# Patient Record
Sex: Female | Born: 1940 | Race: White | Hispanic: No | Marital: Married | Smoking: Never smoker
Health system: Southern US, Community
[De-identification: ages and names within clinical notes are randomized; demographics above are authoritative.]

## PROBLEM LIST (undated history)

## (undated) DIAGNOSIS — I10 Essential (primary) hypertension: Secondary | ICD-10-CM

## (undated) DIAGNOSIS — M1990 Unspecified osteoarthritis, unspecified site: Secondary | ICD-10-CM

## (undated) DIAGNOSIS — E119 Type 2 diabetes mellitus without complications: Secondary | ICD-10-CM

## (undated) DIAGNOSIS — G4730 Sleep apnea, unspecified: Secondary | ICD-10-CM

## (undated) DIAGNOSIS — E785 Hyperlipidemia, unspecified: Secondary | ICD-10-CM

## (undated) DIAGNOSIS — D472 Monoclonal gammopathy: Secondary | ICD-10-CM

## (undated) DIAGNOSIS — E079 Disorder of thyroid, unspecified: Secondary | ICD-10-CM

## (undated) HISTORY — DX: Monoclonal gammopathy: D47.2

## (undated) HISTORY — PX: OVARY SURGERY: SHX727

## (undated) HISTORY — DX: Unspecified osteoarthritis, unspecified site: M19.90

## (undated) HISTORY — DX: Hyperlipidemia, unspecified: E78.5

## (undated) HISTORY — DX: Essential (primary) hypertension: I10

## (undated) HISTORY — PX: TONSILLECTOMY AND ADENOIDECTOMY: SUR1326

## (undated) HISTORY — DX: Sleep apnea, unspecified: G47.30

## (undated) HISTORY — PX: NOSE SURGERY: SHX723

## (undated) HISTORY — DX: Disorder of thyroid, unspecified: E07.9

## (undated) HISTORY — PX: CARPAL TUNNEL RELEASE: SHX101

## (undated) HISTORY — DX: Type 2 diabetes mellitus without complications: E11.9

---

## 1998-01-26 ENCOUNTER — Other Ambulatory Visit: Admission: RE | Admit: 1998-01-26 | Discharge: 1998-01-26 | Payer: Self-pay | Admitting: Obstetrics and Gynecology

## 1998-03-24 ENCOUNTER — Ambulatory Visit (HOSPITAL_COMMUNITY): Admission: RE | Admit: 1998-03-24 | Discharge: 1998-03-24 | Payer: Self-pay | Admitting: Obstetrics and Gynecology

## 1999-02-26 ENCOUNTER — Other Ambulatory Visit: Admission: RE | Admit: 1999-02-26 | Discharge: 1999-02-26 | Payer: Self-pay | Admitting: Obstetrics and Gynecology

## 2000-02-25 ENCOUNTER — Other Ambulatory Visit: Admission: RE | Admit: 2000-02-25 | Discharge: 2000-02-25 | Payer: Self-pay | Admitting: Obstetrics and Gynecology

## 2001-02-28 ENCOUNTER — Other Ambulatory Visit: Admission: RE | Admit: 2001-02-28 | Discharge: 2001-02-28 | Payer: Self-pay | Admitting: Obstetrics and Gynecology

## 2002-03-08 ENCOUNTER — Other Ambulatory Visit: Admission: RE | Admit: 2002-03-08 | Discharge: 2002-03-08 | Payer: Self-pay | Admitting: Obstetrics and Gynecology

## 2003-03-10 ENCOUNTER — Other Ambulatory Visit: Admission: RE | Admit: 2003-03-10 | Discharge: 2003-03-10 | Payer: Self-pay | Admitting: Obstetrics and Gynecology

## 2006-09-19 ENCOUNTER — Ambulatory Visit: Payer: Self-pay | Admitting: Gastroenterology

## 2006-09-22 ENCOUNTER — Ambulatory Visit: Payer: Self-pay | Admitting: Gastroenterology

## 2007-10-25 ENCOUNTER — Encounter: Admission: RE | Admit: 2007-10-25 | Discharge: 2007-10-25 | Payer: Self-pay | Admitting: Internal Medicine

## 2009-05-11 ENCOUNTER — Encounter: Payer: Self-pay | Admitting: Gastroenterology

## 2009-05-15 ENCOUNTER — Ambulatory Visit: Payer: Self-pay | Admitting: Oncology

## 2009-05-15 ENCOUNTER — Telehealth (INDEPENDENT_AMBULATORY_CARE_PROVIDER_SITE_OTHER): Payer: Self-pay

## 2009-05-15 ENCOUNTER — Encounter (INDEPENDENT_AMBULATORY_CARE_PROVIDER_SITE_OTHER): Payer: Self-pay

## 2009-05-22 LAB — CBC WITH DIFFERENTIAL/PLATELET
BASO%: 0.4 % (ref 0.0–2.0)
EOS%: 1.7 % (ref 0.0–7.0)
HCT: 37.2 % (ref 34.8–46.6)
LYMPH%: 25.9 % (ref 14.0–49.7)
MCH: 32.4 pg (ref 25.1–34.0)
MCHC: 33.9 g/dL (ref 31.5–36.0)
NEUT%: 66.6 % (ref 38.4–76.8)
Platelets: 216 10*3/uL (ref 145–400)

## 2009-05-26 LAB — IMMUNOFIXATION ELECTROPHORESIS
IgA: 309 mg/dL (ref 68–378)
IgG (Immunoglobin G), Serum: 1120 mg/dL (ref 694–1618)
IgM, Serum: 69 mg/dL (ref 60–263)

## 2009-05-26 LAB — COMPREHENSIVE METABOLIC PANEL
AST: 24 U/L (ref 0–37)
Alkaline Phosphatase: 60 U/L (ref 39–117)
BUN: 16 mg/dL (ref 6–23)
Calcium: 10.7 mg/dL — ABNORMAL HIGH (ref 8.4–10.5)
Chloride: 100 mEq/L (ref 96–112)
Creatinine, Ser: 0.82 mg/dL (ref 0.40–1.20)

## 2009-05-26 LAB — KAPPA/LAMBDA LIGHT CHAINS: Lambda Free Lght Chn: 1.7 mg/dL (ref 0.57–2.63)

## 2009-06-01 LAB — UIFE/LIGHT CHAINS/TP QN, 24-HR UR
Beta, Urine: DETECTED — AB
Free Kappa Lt Chains,Ur: 0.13 mg/dL (ref 0.04–1.51)
Free Lambda Lt Chains,Ur: <0.05 mg/dL — ABNORMAL LOW (ref 0.08–1.01)
Free Lt Chn Excr Rate: 2.93 mg/d
Gamma Globulin, Urine: DETECTED — AB
Total Protein, Urine-Ur/day: 18 mg/d (ref 10–140)
Volume, Urine: 2250 mL

## 2009-06-01 LAB — CREATININE CLEARANCE, URINE, 24 HOUR
Collection Interval-CRCL: 24 hours
Creatinine Clearance: 95 mL/min (ref 75–115)
Creatinine, 24H Ur: 1003 mg/d (ref 700–1800)

## 2009-06-10 ENCOUNTER — Encounter (INDEPENDENT_AMBULATORY_CARE_PROVIDER_SITE_OTHER): Payer: Self-pay | Admitting: *Deleted

## 2009-06-29 ENCOUNTER — Encounter (INDEPENDENT_AMBULATORY_CARE_PROVIDER_SITE_OTHER): Payer: Self-pay | Admitting: *Deleted

## 2009-06-30 ENCOUNTER — Encounter (INDEPENDENT_AMBULATORY_CARE_PROVIDER_SITE_OTHER): Payer: Self-pay | Admitting: *Deleted

## 2009-06-30 ENCOUNTER — Ambulatory Visit: Payer: Self-pay | Admitting: Gastroenterology

## 2009-07-03 ENCOUNTER — Telehealth: Payer: Self-pay | Admitting: Gastroenterology

## 2009-07-08 ENCOUNTER — Ambulatory Visit: Payer: Self-pay | Admitting: Gastroenterology

## 2009-07-08 LAB — CONVERTED CEMR LAB: IgA: 255 mg/dL (ref 68–378)

## 2009-07-10 LAB — CONVERTED CEMR LAB: Tissue Transglutaminase Ab, IgA: 0.6 units (ref <7)

## 2009-07-14 ENCOUNTER — Ambulatory Visit: Payer: Self-pay | Admitting: Gastroenterology

## 2009-07-15 ENCOUNTER — Encounter: Payer: Self-pay | Admitting: Gastroenterology

## 2009-09-22 ENCOUNTER — Ambulatory Visit: Payer: Self-pay | Admitting: Oncology

## 2009-09-24 LAB — CBC WITH DIFFERENTIAL/PLATELET
Basophils Absolute: 0 10*3/uL (ref 0.0–0.1)
Eosinophils Absolute: 0.1 10*3/uL (ref 0.0–0.5)
HCT: 34 % — ABNORMAL LOW (ref 34.8–46.6)
LYMPH%: 29.4 % (ref 14.0–49.7)
MCV: 94.2 fL (ref 79.5–101.0)
MONO#: 0.4 10*3/uL (ref 0.1–0.9)
MONO%: 6.9 % (ref 0.0–14.0)
NEUT#: 3.2 10*3/uL (ref 1.5–6.5)
NEUT%: 61.5 % (ref 38.4–76.8)
Platelets: 196 10*3/uL (ref 145–400)
WBC: 5.2 10*3/uL (ref 3.9–10.3)

## 2009-09-25 LAB — IGG, IGA, IGM
IgA: 273 mg/dL (ref 68–378)
IgG (Immunoglobin G), Serum: 987 mg/dL (ref 694–1618)
IgM, Serum: 66 mg/dL (ref 60–263)

## 2009-09-25 LAB — KAPPA/LAMBDA LIGHT CHAINS: Kappa free light chain: 0.64 mg/dL (ref 0.33–1.94)

## 2009-09-25 LAB — COMPREHENSIVE METABOLIC PANEL
ALT: 19 U/L (ref 0–35)
BUN: 17 mg/dL (ref 6–23)
CO2: 30 mEq/L (ref 19–32)
Calcium: 9.4 mg/dL (ref 8.4–10.5)
Chloride: 100 mEq/L (ref 96–112)
Creatinine, Ser: 0.73 mg/dL (ref 0.40–1.20)
Glucose, Bld: 168 mg/dL — ABNORMAL HIGH (ref 70–99)

## 2009-12-05 ENCOUNTER — Ambulatory Visit: Payer: Self-pay | Admitting: Internal Medicine

## 2010-01-27 ENCOUNTER — Ambulatory Visit: Payer: Self-pay | Admitting: Oncology

## 2010-01-29 LAB — COMPREHENSIVE METABOLIC PANEL
ALT: 21 U/L (ref 0–35)
Albumin: 3.9 g/dL (ref 3.5–5.2)
Alkaline Phosphatase: 56 U/L (ref 39–117)
CO2: 30 mEq/L (ref 19–32)
Glucose, Bld: 160 mg/dL — ABNORMAL HIGH (ref 70–99)
Potassium: 3.8 mEq/L (ref 3.5–5.3)
Sodium: 139 mEq/L (ref 135–145)
Total Bilirubin: 0.4 mg/dL (ref 0.3–1.2)
Total Protein: 7 g/dL (ref 6.0–8.3)

## 2010-01-29 LAB — CBC WITH DIFFERENTIAL/PLATELET
Basophils Absolute: 0 10*3/uL (ref 0.0–0.1)
Eosinophils Absolute: 0.1 10*3/uL (ref 0.0–0.5)
HGB: 11.7 g/dL (ref 11.6–15.9)
MCV: 93.3 fL (ref 79.5–101.0)
MONO%: 6.6 % (ref 0.0–14.0)
NEUT#: 3.3 10*3/uL (ref 1.5–6.5)
RBC: 3.74 10*6/uL (ref 3.70–5.45)
RDW: 13.1 % (ref 11.2–14.5)
WBC: 5.3 10*3/uL (ref 3.9–10.3)
lymph#: 1.5 10*3/uL (ref 0.9–3.3)

## 2010-01-29 LAB — IGG, IGA, IGM: IgG (Immunoglobin G), Serum: 1120 mg/dL (ref 694–1618)

## 2010-01-29 LAB — LACTATE DEHYDROGENASE: LDH: 145 U/L (ref 94–250)

## 2010-07-06 NOTE — Letter (Signed)
Summary: Patient [facility] Biopsy Results  [facility]  [address] [place], [place] [address]   Phone: [phone]  Fax: [phone]        July 15, 2009 MRN: [id number]    [facility] [address] [place], [place]  [address]    Dear Ms. Munsch,  I am pleased to inform you that the biopsies taken during your recent endoscopic examination did not show any evidence of cancer upon pathologic examination. The biopsies were normal.  Continue with the treatment plan as outlined on the day of your      exam.  Please call us if you are having persistent problems or have questions about your condition that have not been fully answered at this time.  Sincerely,  Meryl Dare MD [facility]  This letter has been electronically signed by your physician.

## 2010-07-06 NOTE — Progress Notes (Signed)
Summary: ? re: labs   Phone Note Call from Patient Call back at Home Phone [phone]   Caller: Patient Call For: Russella Dar Reason for Call: Talk to Nurse Summary of Call: Patient has questions regarding her procedure. She is scheduled for an EGD on 07-14-09 but she received a letter stating that she needs to have blood work done before her procedure. Patient wants to verify this with a nurse Initial call taken by: Tawni Levy,  July 03, 2009 9:30 AM  Follow-up for Phone Call        Called pt and left message we had returned her call and to call our office back. Ulis Rias RN  July 03, 2009 2:37 PM  I have left the patient a message reminding of the lab work Dr Russella Dar requested.  I have asked her to come for lab work prior to the EGD 07-14-09 Follow-up by: Darcey Nora RN, CGRN,  July 06, 2009 9:53 AM

## 2010-07-06 NOTE — Procedures (Signed)
Summary: Upper Endoscopy  Patient: Felicia Cortez Note: All result statuses are Final unless otherwise noted.  Tests: (1) Upper Endoscopy (EGD)   EGD Upper Endoscopy       DONE     [facility]     [address]     [place], [place]  [address]           ENDOSCOPY PROCEDURE REPORT           PATIENT:  Felicia Cortez, Felicia Cortez  MR#:  [id number]     BIRTHDATE:  02-Apr-1941, 68 yrs. old  GENDER:  female           ENDOSCOPIST:  Judie Petit T. Russella Dar, MD, [facility]           PROCEDURE DATE:  07/14/2009     PROCEDURE:  EGD with biopsy     ASA CLASS:  Class II     INDICATIONS:  iron deficiency anemia           MEDICATIONS:  Fentanyl 50 mcg IV, Versed 5 mg IV     TOPICAL ANESTHETIC:  Exactacain Spray           DESCRIPTION OF PROCEDURE:   After the risks benefits and     alternatives of the procedure were thoroughly explained, informed     consent was obtained.  The LB GIF-H180 [id number] endoscope was     introduced through the mouth and advanced to the second portion of     the duodenum, without limitations.  The instrument was slowly     withdrawn as the mucosa was fully examined.     <<PROCEDUREIMAGES>>           Mild gastritis was found in the total stomach. It was mild,     erythematous and granular. Biopsies of the antrum and body of the     stomach were obtained and sent to pathology. The esophagus and     gastroesophageal junction were completely normal in appearance.     The duodenal bulb was normal in appearance, as was the postbulbar     duodenum. Random biopsies were obtained in the duodenum and sent     to pathology.  Retroflexed views revealed no abnormalities. The     scope was then withdrawn from the patient and the procedure     completed.           COMPLICATIONS:  None           ENDOSCOPIC IMPRESSION:     1) Mild, diffuse gastritis           RECOMMENDATIONS:     1) await pathology results     2) follow-up: primary MD as planned     3) If her fe deficiency anemia is refractory  to treatment consider     a capsule endoscopy to further evaluate           Ellena Kamen T. Russella Dar, MD, [facility]           CC:  Creola Corn, MD           n.     Rosalie DoctorVenita Lick. Jamori Biggar at 07/14/2009 08:55 AM           Thurmond Butts, [id number]  Note: An exclamation mark (!) indicates a result that was not dispersed into the flowsheet. Document Creation Date: 07/14/2009 8:56 AM _______________________________________________________________________  (1) Order result status: Final Collection or observation date-time: 07/14/2009 08:46 Requested date-time:  Receipt date-time:  Reported date-time:  Referring Physician:   Ordering Physician: Claudette Head [phone]) Specimen Source:  Source: Launa Grill Order Number: [id number] Lab site:

## 2010-07-06 NOTE — Miscellaneous (Signed)
Summary: [facility] Previsit/prep  Clinical Lists Changes  Observations: Added new observation of NKA: T (06/30/2009 14:16)

## 2010-07-06 NOTE — Letter (Signed)
Summary: Appointment Reminder  [facility]  [address] [place], [place] [address]   Phone: [phone]  Fax: [phone]        June 10, 2009 MRN: [id number]    [facility] [address] [place], [place]  [address]    Dear Ms. Chastang,   We have been unable to reach you by phone to reschedule a Endoscopy  appointment that was recommended for you by Dr. Russella Dar. It is very   important that we reach you to reschedule this  appointment because the   physician will not be available on June 19, 2009. We apologize for   the inconvenience and thank you for allowing  [place] to participate in your   health care needs. Please contact us at [phone] at your earliest   convenience to reschedule your appointment.     Sincerely,  [employer]

## 2010-07-06 NOTE — Letter (Signed)
Summary: Diabetic Instructions  [facility]  [address] [place], [place] [address]   Phone: [phone]  Fax: [phone]    Felicia Cortez 03-05-1941 MRN: [id number]   _  _   ORAL DIABETIC MEDICATION INSTRUCTIONS  The day before your procedure:   Take your diabetic pill as you do normally  The day of your procedure:   Do not take your diabetic pill    We will check your blood sugar levels during the admission process and again in Recovery before discharging you home  ________________________________________________________________________

## 2010-07-06 NOTE — Letter (Signed)
Summary: EGD Instructions  [facility]  [address] [place], [place] [address]   Phone: [phone]  Fax: [phone]       Felicia Cortez    12-11-1940    MRN: [id number]       Procedure Day Dorna Bloom:  Jake Shark 07/14/09     Arrival Time:  7:30AM     Procedure Time:  8:30AM     Location of Procedure:                    [facility] (4th Floor)    PREPARATION FOR ENDOSCOPY   On 07/14/09 THE DAY OF THE PROCEDURE:  1.   No solid foods, milk or milk products are allowed after midnight the night before your procedure.  2.   Do not drink anything colored red or purple.  Avoid juices with pulp.  No orange juice.  3.  You may drink clear liquids until 6:30AM, which is 2 hours before your procedure.                                                                                                CLEAR LIQUIDS INCLUDE: Water Jello Ice Popsicles Tea (sugar ok, no milk/cream) Powdered fruit flavored drinks Coffee (sugar ok, no milk/cream) Gatorade Juice: apple, white grape, white cranberry  Lemonade Clear bullion, consomm, broth Carbonated beverages (any kind) Strained chicken noodle soup Hard Candy   MEDICATION INSTRUCTIONS  Unless otherwise instructed, you should take regular prescription medications with a small sip of water as early as possible the morning of your procedure.  Diabetic patients - see separate instructions.   Additional medication instructions: Hold Blood pressure medicine and diuretic the morning of procedure.             OTHER INSTRUCTIONS  You will need a responsible adult at least 69 years of age to accompany you and drive you home.   This person must remain in the waiting room during your procedure.  Wear loose fitting clothing that is easily removed.  Leave jewelry and other valuables at home.  However, you may wish to bring a book to read or an iPod/MP3 player to listen to music as you wait for your procedure to  start.  Remove all body piercing jewelry and leave at home.  Total time from sign-in until discharge is approximately 2-3 hours.  You should go home directly after your procedure and rest.  You can resume normal activities the day after your procedure.  The day of your procedure you should not:   Drive   Make legal decisions   Operate machinery   Drink alcohol   Return to work  You will receive specific instructions about eating, activities and medications before you leave.    The above instructions have been reviewed and explained to me by  Wyona Almas RN  June 30, 2009 3:02 PM     I fully understand and can verbalize these instructions _____________________________ Date _________

## 2010-08-02 ENCOUNTER — Other Ambulatory Visit (HOSPITAL_COMMUNITY): Payer: Self-pay | Admitting: Oncology

## 2010-08-02 ENCOUNTER — Encounter (HOSPITAL_BASED_OUTPATIENT_CLINIC_OR_DEPARTMENT_OTHER): Payer: Medicare Other | Admitting: Oncology

## 2010-08-02 DIAGNOSIS — D472 Monoclonal gammopathy: Secondary | ICD-10-CM

## 2010-08-02 LAB — LACTATE DEHYDROGENASE: LDH: 126 U/L (ref 94–250)

## 2010-08-02 LAB — CBC WITH DIFFERENTIAL/PLATELET
BASO%: 0.4 % (ref 0.0–2.0)
HCT: 34.4 % — ABNORMAL LOW (ref 34.8–46.6)
LYMPH%: 27.8 % (ref 14.0–49.7)
MCHC: 34.4 g/dL (ref 31.5–36.0)
MCV: 92.2 fL (ref 79.5–101.0)
MONO#: 0.5 10*3/uL (ref 0.1–0.9)
NEUT%: 62.5 % (ref 38.4–76.8)
Platelets: 194 10*3/uL (ref 145–400)
WBC: 5.9 10*3/uL (ref 3.9–10.3)

## 2010-08-02 LAB — COMPREHENSIVE METABOLIC PANEL
ALT: 21 U/L (ref 0–35)
CO2: 29 mEq/L (ref 19–32)
Creatinine, Ser: 0.8 mg/dL (ref 0.40–1.20)
Glucose, Bld: 173 mg/dL — ABNORMAL HIGH (ref 70–99)
Total Bilirubin: 0.4 mg/dL (ref 0.3–1.2)

## 2010-08-02 LAB — IGG, IGA, IGM
IgG (Immunoglobin G), Serum: 1100 mg/dL (ref 694–1618)
IgM, Serum: 67 mg/dL (ref 60–263)

## 2010-08-02 LAB — SEDIMENTATION RATE: Sed Rate: 36 mm/hr — ABNORMAL HIGH (ref 0–22)

## 2010-10-22 NOTE — Assessment & Plan Note (Signed)
[facility]                         GASTROENTEROLOGY OFFICE NOTE   SATARA, VIRELLA                        MRN:          [id number]  DATE:09/19/2006                            DOB:          05-25-1941    REASON FOR REFERRAL:  Diarrhea, abdominal pain, and rectal bleeding.   HISTORY OF PRESENT ILLNESS:  Mrs. Felicia Cortez is a 70 year old female that I  have seen in the past with a history of diverticulitis, GERD, bleeding  hemorrhoids, and hepatic steatosis. She underwent colonoscopy with  destruction of internal hemorrhoids in April 2002. Sigmoid colon  diverticulosis was noted at that time. She relates problems with lower  abdominal pain and frequent diarrhea since beginning Metformin and  Glumetza. These medications started in January and she has been using  Imodium to attempt to control the symptoms. Glumetza was stopped this  past Thursday and her abdominal pain and diarrhea have abated. She has  noted small amounts of bright red blood per rectum over the past two  weeks associated with loose stools. She has no weight loss, nausea,  vomiting, fevers, or chills, and she denies any recent antibiotic usage.  CBC from Dr. Ferd Hibbs office dated 09/15/06 was unremarkable.   FAMILY HISTORY:  Negative for colon cancer, colon polyps, and  inflammatory bowel disease.   CURRENT MEDICATIONS:  Listed on the chart, updated and reviewed.   ALLERGIES:  None known.   PAST MEDICAL HISTORY:  Diabetes mellitus, hypertension, hyperlipidemia,  hypothyroidism, sleep apnea on CPAP, diverticulosis, internal  hemorrhoids, history of diverticulitis, history of hepatic steatosis,  status post right carpal tunnel release, status post partial bilateral  oophorectomy in 1980, status post tonsillectomy, status post B and C,  status post appendectomy.   SOCIAL HISTORY:  Per the handwritten form.   REVIEW OF SYSTEMS:  Per the handwritten form.   PHYSICAL EXAMINATION:  GENERAL:   Obese white female in no acute  distress.  VITAL SIGNS:  Height 5 feet 1 1/2 inches, weight 222 pounds, blood  pressure 114/62, pulse 68 and regular.  HEENT:  Anicteric sclerae, oral pharynx clear.  CHEST:  Clear to auscultation bilaterally.  CARDIAC:  Regular rate and rhythm without murmurs appreciated.  ABDOMEN:  Large, soft, nontender, nondistended, normal active bowel  sounds, no palpable organomegaly, masses, or hernias.  RECTAL:  Differed to time of colonoscopy.  EXTREMITIES:  Without cyanosis, clubbing, or edema.  NEUROLOGIC:  Awake, alert, and oriented x3. Grossly non-focal.   ASSESSMENT AND PLAN:  1. Diarrhea and hematochezia. Her diarrhea appears to be a side effect      of Glumetza. Since this medication was discontinued, her symptoms      have abated. I suspect she has recurrent hemorrhoids leading to her      rectal bleeding, but colorectal neoplasms and other disorders need      to be excluded. Risk, benefits, and alternatives to colonoscopy      with possibly biopsy, possibly polypectomy, and possible      destruction of internal hemorrhoids discussed with the patient and      she consents to proceed. This  will be scheduled electively.  2. Diverticulosis and history of diverticulitis. Maintain a long term      high fiber diet with adequate fluid intake.     Venita Lick. Russella Dar, MD, [facility]  Electronically Signed    MTS/MedQ  DD: 09/20/2006  DT: 09/21/2006  Job #: [id number]   cc:   Gwen Pounds, MD

## 2011-02-01 ENCOUNTER — Encounter (HOSPITAL_BASED_OUTPATIENT_CLINIC_OR_DEPARTMENT_OTHER): Payer: Medicare Other | Admitting: Oncology

## 2011-02-01 ENCOUNTER — Other Ambulatory Visit (HOSPITAL_COMMUNITY): Payer: Self-pay | Admitting: Oncology

## 2011-02-01 DIAGNOSIS — D472 Monoclonal gammopathy: Secondary | ICD-10-CM

## 2011-02-01 LAB — CBC WITH DIFFERENTIAL/PLATELET
BASO%: 0.4 % (ref 0.0–2.0)
Basophils Absolute: 0 10*3/uL (ref 0.0–0.1)
EOS%: 1.9 % (ref 0.0–7.0)
HCT: 35.9 % (ref 34.8–46.6)
LYMPH%: 26.9 % (ref 14.0–49.7)
MCH: 32.2 pg (ref 25.1–34.0)
MCHC: 34.9 g/dL (ref 31.5–36.0)
MCV: 92.2 fL (ref 79.5–101.0)
MONO%: 8.3 % (ref 0.0–14.0)
NEUT%: 62.5 % (ref 38.4–76.8)
Platelets: 167 10*3/uL (ref 145–400)

## 2011-02-02 LAB — COMPREHENSIVE METABOLIC PANEL
ALT: 26 U/L (ref 0–35)
AST: 28 U/L (ref 0–37)
Calcium: 10.1 mg/dL (ref 8.4–10.5)
Chloride: 97 mEq/L (ref 96–112)
Creatinine, Ser: 0.87 mg/dL (ref 0.50–1.10)

## 2011-02-02 LAB — IGG, IGA, IGM: IgM, Serum: 63 mg/dL (ref 52–322)

## 2011-06-16 ENCOUNTER — Telehealth: Payer: Self-pay | Admitting: Oncology

## 2011-06-16 NOTE — Telephone Encounter (Signed)
spoke w/ spouse and he is aware of 08/08/11 appt   aom

## 2011-06-17 ENCOUNTER — Telehealth: Payer: Self-pay

## 2011-06-17 NOTE — Telephone Encounter (Signed)
Received a letter of denial of claim from insurance company. Felicia Cortez said she would take letter to billing.

## 2011-08-08 ENCOUNTER — Other Ambulatory Visit: Payer: Medicare Other | Admitting: Lab

## 2011-08-08 ENCOUNTER — Ambulatory Visit (HOSPITAL_BASED_OUTPATIENT_CLINIC_OR_DEPARTMENT_OTHER): Payer: Medicare Other | Admitting: Oncology

## 2011-08-08 ENCOUNTER — Encounter: Payer: Self-pay | Admitting: Oncology

## 2011-08-08 ENCOUNTER — Telehealth: Payer: Self-pay | Admitting: Oncology

## 2011-08-08 DIAGNOSIS — E538 Deficiency of other specified B group vitamins: Secondary | ICD-10-CM

## 2011-08-08 DIAGNOSIS — D472 Monoclonal gammopathy: Secondary | ICD-10-CM

## 2011-08-08 DIAGNOSIS — E119 Type 2 diabetes mellitus without complications: Secondary | ICD-10-CM

## 2011-08-08 DIAGNOSIS — D649 Anemia, unspecified: Secondary | ICD-10-CM | POA: Insufficient documentation

## 2011-08-08 LAB — CBC WITH DIFFERENTIAL/PLATELET
Basophils Absolute: 0 10*3/uL (ref 0.0–0.1)
Eosinophils Absolute: 0.1 10*3/uL (ref 0.0–0.5)
HCT: 33.1 % — ABNORMAL LOW (ref 34.8–46.6)
HGB: 11.5 g/dL — ABNORMAL LOW (ref 11.6–15.9)
MCH: 31.8 pg (ref 25.1–34.0)
MONO#: 0.4 10*3/uL (ref 0.1–0.9)
NEUT%: 63 % (ref 38.4–76.8)
WBC: 5.8 10*3/uL (ref 3.9–10.3)
lymph#: 1.6 10*3/uL (ref 0.9–3.3)

## 2011-08-08 NOTE — Progress Notes (Signed)
CC:   Gwen Pounds, MD  PROBLEM LIST: 1. 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  Urine immunofixation electrophoresis was     negative.  24 hour urine protein was 18.  The patient has not     undergone metastatic bone survey or a bone marrow exam. 2. Low-grade intermittent anemia. 3. History of pernicious anemia diagnosed 2005, on sublingual vitamin     B12 supplements. 4. Diabetes mellitus type 2. 5. Dyslipidemia. 6. Hypertension. 7. Hypothyroidism. 8. Sleep apnea treated with CPAP. 9. History of elevated sedimentation rate. 10.Soft systolic ejection murmur.  MEDICATIONS: 1. Calcium and vitamin D 500-200 mg 1 daily. 2. Byetta inject 10 units twice daily. 3. Amaryl 4 mg twice daily. 4. Hydrochlorothiazide 12.5 mg daily. 5. Lantus insulin 30 units at bedtime. 6. Avalide 150-12.5 mg daily. 7. Levothyroxine 100 mcg daily. 8. Multivitamins daily. 9. Zocor 20 mg at bedtime.  HISTORY:  I am seeing Mrs. 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  The patient was last seen by [place] on 02/01/2011.  There has been no significant change in her condition. She does have some feelings of fatigue, postnasal drip, alteration of taste over the past 6 months, and some swelling of her fingers.  She may be developing some arthritis.  The patient denies any evidence of blood in her stools.  She had a colonoscopy a few years ago.  She tells me that her brother was recently diagnosed with prostate cancer receiving treatment at [facility] and her sister was recently diagnosed with melanoma.  PHYSICAL EXAM:  The patient is overweight.  Weight is 202.9 pounds. There has been conscious and successful weight loss over the past couple of years when the patient weighed 230 pounds.  Height 5 feet 1 inch, body surface area 1.99 sq m, blood pressure 120/60.   Other vital signs are normal.  There is no scleral icterus.  Mouth and pharynx are benign. There is no peripheral adenopathy palpable.  No musculoskeletal tenderness.  Lungs:  Clear to percussion auscultation.  Cardiac: Regular rhythm with soft systolic ejection murmur.  Breasts:  Not examined.  The patient does have regular mammograms.  Abdomen:  Quite obese, nontender with no organomegaly or masses palpable.  Extremities: No peripheral edema or clubbing.  She may have some lipomas in her upper legs.  The patient showed me some swelling of her fingers with tenderness.  There did not appear to be any acute inflammatory changes however.  Neurologic:  Grossly normal.  LABORATORY DATA:  Today, white count 5.8, ANC 3.6, hemoglobin 11.5, hematocrit 33.1, platelets 178,000.  On 08/28 hemoglobin was 12.5 hematocrit 35.9 which is normal.  Chemistries and sedimentation rate today are pending.  Chemistries from 02/01/2011 were normal except for a glucose of 300.  BUN was 17, creatinine 0.87.  Sedimentation rate on 08/02/2010 was 36 as compared with 49 on 01/29/2010 and 50 on 09/24/2009.  On 02/01/2011 the IgG level was 1090, IgA 279, IgM 63 all of which are normal.  IMAGING STUDIES:  Ultrasound of the abdomen on 10/25/2007 showed normal sized spleen with no acute findings and no evidence for gallbladder disease.  IMPRESSION AND PLAN:  Mrs. Lasky continues with stable and normal immunoglobulin levels and no signs of progression of multiple myeloma.  Once again she is mildly anemic.  We are giving her stool cards so we  can check for occult blood.  It is possible she may have an autoimmune disease to account for her elevated sedimentation rate and now some developing changes in her fingers associated with swelling and pain.  Will plan to see Ms. Mcconnon again in 6 months at which time we will check CBC, chemistries, quantitative immunoglobulins, iron studies and vitamin B12 level as the patient  does have history of pernicious anemia.  I have also given her stool cards to check for occult blood.    ______________________________ Samul Dada, M.D. DSM/MEDQ  D:  08/08/2011  T:  08/08/2011  Job:  [id number]

## 2011-08-08 NOTE — Telephone Encounter (Signed)
l/m with 02/13/12 appts aom

## 2011-08-08 NOTE — Progress Notes (Signed)
This office note has been dictated.  #[id number]

## 2011-08-09 LAB — COMPREHENSIVE METABOLIC PANEL
ALT: 20 U/L (ref 0–35)
AST: 20 U/L (ref 0–37)
Albumin: 3.9 g/dL (ref 3.5–5.2)
Alkaline Phosphatase: 58 U/L (ref 39–117)
BUN: 20 mg/dL (ref 6–23)
Calcium: 9.8 mg/dL (ref 8.4–10.5)
Chloride: 101 mEq/L (ref 96–112)
Potassium: 4 mEq/L (ref 3.5–5.3)
Sodium: 140 mEq/L (ref 135–145)
Total Protein: 6.9 g/dL (ref 6.0–8.3)

## 2011-08-09 LAB — IGG, IGA, IGM: IgG (Immunoglobin G), Serum: 1340 mg/dL (ref 690–1700)

## 2012-02-13 ENCOUNTER — Ambulatory Visit: Payer: Medicare Other | Admitting: Oncology

## 2012-02-13 ENCOUNTER — Encounter: Payer: Self-pay | Admitting: Family

## 2012-02-13 ENCOUNTER — Telehealth: Payer: Self-pay | Admitting: Oncology

## 2012-02-13 ENCOUNTER — Other Ambulatory Visit (HOSPITAL_BASED_OUTPATIENT_CLINIC_OR_DEPARTMENT_OTHER): Payer: Medicare Other

## 2012-02-13 ENCOUNTER — Ambulatory Visit (HOSPITAL_BASED_OUTPATIENT_CLINIC_OR_DEPARTMENT_OTHER): Payer: Medicare Other | Admitting: Family

## 2012-02-13 VITALS — BP 115/70 | HR 77 | Temp 97.3°F | Resp 20 | Ht 61.0 in | Wt 207.3 lb

## 2012-02-13 DIAGNOSIS — D649 Anemia, unspecified: Secondary | ICD-10-CM

## 2012-02-13 DIAGNOSIS — R5381 Other malaise: Secondary | ICD-10-CM

## 2012-02-13 DIAGNOSIS — E538 Deficiency of other specified B group vitamins: Secondary | ICD-10-CM

## 2012-02-13 DIAGNOSIS — D472 Monoclonal gammopathy: Secondary | ICD-10-CM

## 2012-02-13 DIAGNOSIS — G4700 Insomnia, unspecified: Secondary | ICD-10-CM

## 2012-02-13 LAB — COMPREHENSIVE METABOLIC PANEL (CC13)
ALT: 25 U/L (ref 0–55)
AST: 26 U/L (ref 5–34)
Alkaline Phosphatase: 70 U/L (ref 40–150)
BUN: 17 mg/dL (ref 7.0–26.0)
Calcium: 9.6 mg/dL (ref 8.4–10.4)
Creatinine: 0.9 mg/dL (ref 0.6–1.1)
Total Bilirubin: 0.5 mg/dL (ref 0.20–1.20)

## 2012-02-13 LAB — CBC WITH DIFFERENTIAL/PLATELET
Basophils Absolute: 0 10*3/uL (ref 0.0–0.1)
Eosinophils Absolute: 0.1 10*3/uL (ref 0.0–0.5)
HGB: 12.2 g/dL (ref 11.6–15.9)
LYMPH%: 28.1 % (ref 14.0–49.7)
MCV: 91.7 fL (ref 79.5–101.0)
MONO#: 0.5 10*3/uL (ref 0.1–0.9)
NEUT#: 4 10*3/uL (ref 1.5–6.5)
Platelets: 201 10*3/uL (ref 145–400)
RBC: 3.85 10*6/uL (ref 3.70–5.45)
RDW: 12.9 % (ref 11.2–14.5)
WBC: 6.4 10*3/uL (ref 3.9–10.3)

## 2012-02-13 NOTE — Patient Instructions (Addendum)
Patient ID: Felicia Cortez,   DOB: 1940-09-17,  MRN: [id number]   [facility] Discharge Instructions  RECOMMENDATIONS MAD BY THE CONSULTANT AND ANY TEST RESULT(S) WILL BE FORWARDED TO YOU REFERRING DOCTOR   EXAM FINDINGS BY NURSE PRACTITIONER TODAY TO REPORT TO THE CLINIC OR PRIMARY PROVIDER:    Your Current Medications Are: Current Outpatient Prescriptions  Medication Sig Dispense Refill  . Exenatide (BYETTA 10 MCG PEN [place]) Inject 10 Units into the skin 2 (two) times daily.      Marland Kitchen glimepiride (AMARYL) 4 MG tablet Take 4 mg by mouth 2 (two) times daily.      . hydrochlorothiazide (HYDRODIURIL) 12.5 MG tablet Take 12.5 mg by mouth daily.      . insulin glargine (LANTUS) 100 UNIT/ML injection Inject 30 Units into the skin at bedtime.      . irbesartan-hydrochlorothiazide (AVALIDE) 150-12.5 MG per tablet Take 1 tablet by mouth daily.      Marland Kitchen levothyroxine (SYNTHROID, LEVOTHROID) 100 MCG tablet Take 100 mcg by mouth daily.      . simvastatin (ZOCOR) 20 MG tablet Take 20 mg by mouth every evening.      . calcium-vitamin D (OSCAL WITH D) 500-200 MG-UNIT per tablet Take 1 tablet by mouth daily.      . Multiple Vitamin (MULTIVITAMIN) capsule Take 1 capsule by mouth daily.         INSTRUCTIONS GIVEN, DISCUSSED AND FOLLOW-UP: Keep up the great work.  I acknowledge that I have been informed and understand all the instructions given to me and have received a copy.  I do not have any further questions at this time, but I understand that I may call the [facility] at [phone] during business hours should I have any further questions or need assistance in obtaining follow-up care.   02/13/2012, 12:18 PM

## 2012-02-13 NOTE — Telephone Encounter (Signed)
appts made and printed for pt aom °

## 2012-02-13 NOTE — Progress Notes (Signed)
Patient ID: Felicia Cortez, female   DOB: 1941-05-08, 71 y.o.   MRN: [id number] CSN: [id number]  CC: Felicia Pounds, MD  Problem List: Felicia Cortez is a 71 y.o. Caucasian female with a problem list consisting of:  1. 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 Urine immunofixation electrophoresis was negative. 24 hour urine protein was 18. The patient has not undergone metastatic bone survey or a bone marrow exam.  2. Low-grade intermittent anemia.  3. History of pernicious anemia diagnosed 2005, on IM B12 injections 4. Diabetes mellitus type 2.  5. Dyslipidemia.  6. Hypertension.  7. Hypothyroidism.  8. Sleep apnea treated with CPAP.  9. History of elevated sedimentation rate.  10.Soft systolic ejection murmur.  Dr. 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   She is accompanied today by her husband, Felicia Cortez. The patient was last seen by [place] on 08/08/2011.  There has been no significant change in her condition. The patient states that she does have some fatigue, insomnia, on-going alteration of taste, and increased urination over the past two weeks that is described as malodorous.   The patient denies any unusual bleeding/bruising, fever, chills or pain. She had a colonoscopy a few years ago. Her brother has been diagnosed with prostate cancer and is receiving treatment at [facility] and her sister was diagnosed with melanoma.   Past Medical History: History reviewed. No pertinent past medical history.  Surgical History: History reviewed. No pertinent past surgical history.  Current Medications: Current Outpatient Prescriptions  Medication Sig Dispense Refill  . Exenatide (BYETTA 10 MCG PEN [place]) Inject 10 Units into the skin 2 (two) times daily.      Marland Kitchen glimepiride (AMARYL) 4 MG tablet Take 4 mg by mouth 2 (two) times daily.      . hydrochlorothiazide  (HYDRODIURIL) 12.5 MG tablet Take 12.5 mg by mouth daily.      . insulin glargine (LANTUS) 100 UNIT/ML injection Inject 30 Units into the skin at bedtime.      . irbesartan-hydrochlorothiazide (AVALIDE) 150-12.5 MG per tablet Take 1 tablet by mouth daily.      Marland Kitchen levothyroxine (SYNTHROID, LEVOTHROID) 100 MCG tablet Take 100 mcg by mouth daily.      . simvastatin (ZOCOR) 20 MG tablet Take 20 mg by mouth every evening.      . calcium-vitamin D (OSCAL WITH D) 500-200 MG-UNIT per tablet Take 1 tablet by mouth daily.      . Multiple Vitamin (MULTIVITAMIN) capsule Take 1 capsule by mouth daily.        Allergies: No Known Allergies  Family History: History reviewed. No pertinent family history.  Social History: History  Substance Use Topics  . Smoking status: Never Smoker   . Smokeless tobacco: Never Used  . Alcohol Use: No    Review of Systems: 10 Point review of systems was completed and is negative except as noted above.   Physical Exam:   Blood pressure 115/70, pulse 77, temperature 97.3 F (36.3 C), temperature source Oral, resp. rate 20, height 5\' 1"  (1.549 m), weight 207 lb 4.8 oz (94.031 kg).  General appearance: Alert, cooperative, well nourished, obese, no apparent distress Head: Normocephalic, without obvious abnormality, atraumatic Eyes: Conjunctivae clear, cloudy corneas, PERRLA, EOMI Nose: Nares, septum and mucosa are normal, no drainage or sinus tenderness Neck: No adenopathy, supple, symmetrical, trachea midline, no tenderness Resp: Clear to auscultation  bilaterally Cardio: Regular rate and rhythm, S1, S2 normal, 1/6 murmur, no click, rub or gallop GI: Soft, excessive habitus, non-tender, hypoactive bowel sounds Extremities: Extremities normal, atraumatic, no cyanosis or edema Lymph nodes: Cervical, supraclavicular, and axillary nodes normal Neurologic: Grossly normal   Laboratory Data: Results for orders placed in visit on 02/13/12 (from the past 48 hour(s))    CBC WITH DIFFERENTIAL     Status: Normal   Collection Time   02/13/12 10:51 AM      Component Value Range Comment   WBC 6.4  3.9 - 10.3 10e3/uL    NEUT# 4.0  1.5 - 6.5 10e3/uL    HGB 12.2  11.6 - 15.9 g/dL    HCT 16.1  09.6 - 04.5 %    Platelets 201  145 - 400 10e3/uL    MCV 91.7  79.5 - 101.0 fL    MCH 31.7  25.1 - 34.0 pg    MCHC 34.6  31.5 - 36.0 g/dL    RBC 4.09  8.11 - 9.14 10e6/uL    RDW 12.9  11.2 - 14.5 %    lymph# 1.8  0.9 - 3.3 10e3/uL    MONO# 0.5  0.1 - 0.9 10e3/uL    Eosinophils Absolute 0.1  0.0 - 0.5 10e3/uL    Basophils Absolute 0.0  0.0 - 0.1 10e3/uL    NEUT% 62.4  38.4 - 76.8 %    LYMPH% 28.1  14.0 - 49.7 %    MONO% 7.3  0.0 - 14.0 %    EOS% 1.7  0.0 - 7.0 %    BASO% 0.5  0.0 - 2.0 %   COMPREHENSIVE METABOLIC PANEL (CC13)     Status: Abnormal   Collection Time   02/13/12 10:51 AM      Component Value Range Comment   Sodium 140  136 - 145 mEq/L    Potassium 3.9  3.5 - 5.1 mEq/L    Chloride 100  98 - 107 mEq/L    CO2 28  22 - 29 mEq/L    Glucose 175 (*) 70 - 99 mg/dl    BUN 78.2  7.0 - 95.6 mg/dL    Creatinine 0.9  0.6 - 1.1 mg/dL    Total Bilirubin 2.13  0.20 - 1.20 mg/dL    Alkaline Phosphatase 70  40 - 150 U/L    AST 26  5 - 34 U/L    ALT 25  0 - 55 U/L    Total Protein 7.4  6.4 - 8.3 g/dL    Albumin 3.8  3.5 - 5.0 g/dL    Calcium 9.6  8.4 - 08.6 mg/dL   LACTATE DEHYDROGENASE (CC13)     Status: Normal   Collection Time   02/13/12 10:51 AM      Component Value Range Comment   LDH 162  125 - 220 U/L     Imaging Studies: Ultrasound of the abdomen on 10/25/2007 showed normal sized spleen with no acute findings and no evidence for gallbladder  disease.   Impression/Plan: Felicia Cortez does not show any signs of progression of multiple myeloma. Quantitative immunoglobulins, iron studies, sedimentation rate and ferritin are pending. The patient's anemia appears to have improved and the stool cards are not necessary at this time.  Will plan to draw  laboratories again in 6 months at which time we will check CBC, chemistries, quantitative immunoglobulins, iron studies and vitamin B12 level as the patient does have history of pernicious anemia.  We will plan  for the patient to have an office visit and laboratories in 1 years' time.  At that time we will again check CBC, CMP, QIG, iron studies and vitamin B12.    Larina Bras, NP-C 02/13/2012, 11:59 AM

## 2012-02-14 LAB — IRON AND TIBC
%SAT: 25 % (ref 20–55)
Iron: 87 ug/dL (ref 42–145)
TIBC: 355 ug/dL (ref 250–470)
UIBC: 268 ug/dL (ref 125–400)

## 2012-02-14 LAB — VITAMIN B12: Vitamin B-12: 376 pg/mL (ref 211–911)

## 2012-02-14 LAB — IGG, IGA, IGM
IgA: 246 mg/dL (ref 69–380)
IgG (Immunoglobin G), Serum: 1260 mg/dL (ref 690–1700)

## 2012-02-14 LAB — FERRITIN: Ferritin: 75 ng/mL (ref 10–291)

## 2012-08-13 ENCOUNTER — Other Ambulatory Visit (HOSPITAL_BASED_OUTPATIENT_CLINIC_OR_DEPARTMENT_OTHER): Payer: Medicare Other | Admitting: Lab

## 2012-08-13 DIAGNOSIS — D472 Monoclonal gammopathy: Secondary | ICD-10-CM

## 2012-08-13 LAB — COMPREHENSIVE METABOLIC PANEL (CC13)
ALT: 36 U/L (ref 0–55)
CO2: 27 mEq/L (ref 22–29)
Calcium: 10.2 mg/dL (ref 8.4–10.4)
Chloride: 101 mEq/L (ref 98–107)
Sodium: 139 mEq/L (ref 136–145)
Total Protein: 7.5 g/dL (ref 6.4–8.3)

## 2012-08-13 LAB — CBC WITH DIFFERENTIAL/PLATELET
Eosinophils Absolute: 0.1 10*3/uL (ref 0.0–0.5)
LYMPH%: 27.7 % (ref 14.0–49.7)
MONO#: 0.5 10*3/uL (ref 0.1–0.9)
NEUT#: 4.1 10*3/uL (ref 1.5–6.5)
Platelets: 188 10*3/uL (ref 145–400)
RBC: 3.77 10*6/uL (ref 3.70–5.45)
RDW: 13.5 % (ref 11.2–14.5)
WBC: 6.5 10*3/uL (ref 3.9–10.3)

## 2012-08-13 LAB — LACTATE DEHYDROGENASE (CC13): LDH: 153 U/L (ref 125–245)

## 2012-08-14 LAB — IGG, IGA, IGM
IgA: 246 mg/dL (ref 69–380)
IgG (Immunoglobin G), Serum: 1280 mg/dL (ref 690–1700)

## 2012-08-14 LAB — VITAMIN B12: Vitamin B-12: 961 pg/mL — ABNORMAL HIGH (ref 211–911)

## 2013-02-11 ENCOUNTER — Ambulatory Visit (HOSPITAL_BASED_OUTPATIENT_CLINIC_OR_DEPARTMENT_OTHER): Payer: Medicare Other | Admitting: Internal Medicine

## 2013-02-11 ENCOUNTER — Other Ambulatory Visit (HOSPITAL_BASED_OUTPATIENT_CLINIC_OR_DEPARTMENT_OTHER): Payer: Medicare Other | Admitting: Lab

## 2013-02-11 ENCOUNTER — Telehealth: Payer: Self-pay | Admitting: Gastroenterology

## 2013-02-11 ENCOUNTER — Telehealth: Payer: Self-pay | Admitting: Internal Medicine

## 2013-02-11 ENCOUNTER — Encounter: Payer: Self-pay | Admitting: Internal Medicine

## 2013-02-11 VITALS — BP 104/43 | HR 77 | Temp 98.1°F | Resp 18 | Ht 61.0 in | Wt 204.3 lb

## 2013-02-11 DIAGNOSIS — D649 Anemia, unspecified: Secondary | ICD-10-CM

## 2013-02-11 DIAGNOSIS — D472 Monoclonal gammopathy: Secondary | ICD-10-CM

## 2013-02-11 DIAGNOSIS — E538 Deficiency of other specified B group vitamins: Secondary | ICD-10-CM

## 2013-02-11 LAB — IGG, IGA, IGM
IgA: 228 mg/dL (ref 69–380)
IgG (Immunoglobin G), Serum: 1170 mg/dL (ref 690–1700)

## 2013-02-11 LAB — CBC WITH DIFFERENTIAL/PLATELET
Basophils Absolute: 0 10*3/uL (ref 0.0–0.1)
HCT: 35.1 % (ref 34.8–46.6)
HGB: 12.2 g/dL (ref 11.6–15.9)
MONO#: 0.5 10*3/uL (ref 0.1–0.9)
NEUT%: 65 % (ref 38.4–76.8)
Platelets: 182 10*3/uL (ref 145–400)
WBC: 5.9 10*3/uL (ref 3.9–10.3)
lymph#: 1.4 10*3/uL (ref 0.9–3.3)

## 2013-02-11 LAB — COMPREHENSIVE METABOLIC PANEL (CC13)
BUN: 17.7 mg/dL (ref 7.0–26.0)
CO2: 27 mEq/L (ref 22–29)
Calcium: 9.8 mg/dL (ref 8.4–10.4)
Chloride: 102 mEq/L (ref 98–109)
Creatinine: 1.1 mg/dL (ref 0.6–1.1)
Glucose: 223 mg/dl — ABNORMAL HIGH (ref 70–140)

## 2013-02-11 LAB — SEDIMENTATION RATE: Sed Rate: 30 mm/hr — ABNORMAL HIGH (ref 0–22)

## 2013-02-11 LAB — LACTATE DEHYDROGENASE (CC13): LDH: 158 U/L (ref 125–245)

## 2013-02-11 LAB — IRON AND TIBC CHCC
%SAT: 23 % (ref 21–57)
Iron: 78 ug/dL (ref 41–142)

## 2013-02-11 NOTE — Telephone Encounter (Signed)
Patient had colonoscopy 09/22/06 that showed diverticulosis and internal hemorrhoids, recall in for 2016/10/31.  Brother recently died of colon cancer.  Will this change her recall?

## 2013-02-11 NOTE — Telephone Encounter (Signed)
5 year interval for brother with colon cancer and if her report about a 2008 colonoscopy is correct she should have a colonoscopy soon. I cannot locate her colonoscopy report.

## 2013-02-11 NOTE — Patient Instructions (Addendum)
1. RTC in 6 months for repeat labs and visit.  2. Patient provided a MGUS handout.

## 2013-02-11 NOTE — Telephone Encounter (Signed)
gv and printed appt sched and avs forpt for March 2015  °

## 2013-02-12 ENCOUNTER — Telehealth: Payer: Self-pay | Admitting: Medical Oncology

## 2013-02-12 NOTE — Progress Notes (Signed)
Hematology and Oncology Follow Up Visit  Felicia Cortez [id number] 10-24-1940 72 y.o. 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  Urine immunofixation electrophoresis was negative.  24 hour urine protein was 18.  The patient has not undergone metastatic bone survey or a bone marrow exam.  Prior Therapy: None  Current therapy: None  Interim History:  Ms. 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  The patient was last seen by Nurse practitioner Norina Buzzard on 02/13/2012. Today, she complains of decrease in her taste, "nothing taste good" but also reports stable weight. She endorses feeling a bit down since her brother passed away from prostate cancer 3 weeks ago.  She is up-to-date for her cancer screening.  She denies recent hospitalizations and reports compliance to all her medications. There has been no significant change in her condition. She does have some feelings of fatigue, postnasal drip.  The patient denies any evidence of blood in her stools.  She had a colonoscopy a few years ago.    Medications: I have reviewed the patient's current medications.  Current Outpatient Prescriptions  Medication Sig Dispense Refill  . calcium-vitamin D (OSCAL WITH D) 500-200 MG-UNIT per tablet Take 1 tablet by mouth daily.      . Exenatide (BYETTA 10 MCG PEN [place]) Inject 10 Units into the skin 2 (two) times daily.      Marland Kitchen glimepiride (AMARYL) 4 MG tablet Take 4 mg by mouth 2 (two) times daily.      . hydrochlorothiazide (HYDRODIURIL) 12.5 MG tablet Take 12.5 mg by mouth daily.      . insulin glargine (LANTUS) 100 UNIT/ML injection Inject 30 Units into the skin at bedtime.      . irbesartan-hydrochlorothiazide (AVALIDE) 150-12.5 MG per tablet Take 1 tablet by mouth daily.      Marland Kitchen levothyroxine  (SYNTHROID, LEVOTHROID) 100 MCG tablet Take 100 mcg by mouth daily.      . Multiple Vitamin (MULTIVITAMIN) capsule Take 1 capsule by mouth daily.      . simvastatin (ZOCOR) 20 MG tablet Take 20 mg by mouth every evening.       No current facility-administered medications for this visit.     Allergies: No Known Allergies  Past Medical History, Surgical history, Social history, and Family History were reviewed and updated.  Review of Systems: Constitutional:  Negative for fever, chills, night sweats, anorexia, weight loss, pain. Cardiovascular: no chest pain or dyspnea on exertion Respiratory: no cough, shortness of breath, or wheezing Neurological: no TIA or stroke symptoms Dermatological: positive for lipomas in her right upper leg ENT: negative for - epistaxis or headaches Skin: Negative. Gastrointestinal: no abdominal pain, change in bowel habits, or black or bloody stools Genito-Urinary: no dysuria, trouble voiding, or hematuria Hematological and Lymphatic: negative for - bleeding problems Breast: negative for breast lumps Musculoskeletal: negative for - gait disturbance Remaining ROS negative. Physical Exam: Blood pressure 104/43, pulse 77, temperature 98.1 F (36.7 C), temperature source Oral, resp. rate 18, height 5\' 1"  (1.549 m), weight 204 lb 4.8 oz (92.67 kg), SpO2 98.00%. ECOG: 0 General appearance: alert, cooperative, appears stated age, no distress and mildly obese almost tearful at points during the exam Head: Normocephalic, without obvious abnormality, atraumatic Neck: no adenopathy, supple, symmetrical, trachea midline and thyroid not enlarged, symmetric, no tenderness/mass/nodules HEENT: PERRLa; EOMi; OP clear  Lymph nodes: Cervical, supraclavicular, and axillary nodes normal. Heart:regular rate and rhythm, S1, S2 normal, no murmur, click, rub or gallop Lung:chest clear, no wheezing, rales, normal symmetric air entry Abdomin: soft, non-tender, without masses or  organomegaly EXT:No peripheral edema.  Small palpable lipoma on right lower thigh   Lab Results: Lab Results  Component Value Date   WBC 5.9 02/11/2013   HGB 12.2 02/11/2013   HCT 35.1 02/11/2013   MCV 92.4 02/11/2013   PLT 182 02/11/2013     Chemistry      Component Value Date/Time   NA 140 02/11/2013 1027   NA 140 08/08/2011 1041   K 3.9 02/11/2013 1027   K 4.0 08/08/2011 1041   CL 101 08/13/2012 1508   CL 101 08/08/2011 1041   CO2 27 02/11/2013 1027   CO2 27 08/08/2011 1041   BUN 17.7 02/11/2013 1027   BUN 20 08/08/2011 1041   CREATININE 1.1 02/11/2013 1027   CREATININE 0.86 08/08/2011 1041   CREATININE 0.73 05/27/2009 1219      Component Value Date/Time   CALCIUM 9.8 02/11/2013 1027   CALCIUM 9.8 08/08/2011 1041   ALKPHOS 54 02/11/2013 1027   ALKPHOS 58 08/08/2011 1041   AST 24 02/11/2013 1027   AST 20 08/08/2011 1041   ALT 25 02/11/2013 1027   ALT 20 08/08/2011 1041   BILITOT 0.51 02/11/2013 1027   BILITOT 0.4 08/08/2011 1041     Results for BRISHA, MCCABE (MRN [id number]) as of 02/12/2013 22:03  Ref. Range 06/15/2012 11:32 08/13/2012 15:08 02/11/2013 10:27 02/11/2013 10:27 02/11/2013 10:28  IgG (Immunoglobin G), Serum Latest Range: 870-719-5187 mg/dL  8657   8469  IgA Latest Range: 69-380 mg/dL  629   528  IgM, Serum Latest Range: 52-322 mg/dL  60   50 (L)   Iron/TIBC/Ferritin    Component Value Date/Time   IRON 78 02/11/2013 1028   IRON 90 08/13/2012 1508   TIBC 335 02/11/2013 1028   TIBC 365 08/13/2012 1508   FERRITIN 88 02/11/2013 1028   FERRITIN 61 08/13/2012 1508    Radiological Studies: Ultrasound of the abdomen on 10/25/2007 showed normal sized spleen with no acute findings and no evidence for gallbladder disease.  Impression and Plan: 1. MGUS.  [name] continues with stable and normal immunoglobulin levels and no signs of progression of multiple myeloma.  We will plan to see [name] again in 6 months at which time we will check CBC, chemistries, quantitative immunoglobulins, iron studies and vitamin B12 level  as the patient does have history of pernicious anemia.   2. Mood adjustment d/o.    I offered psychosocial services should she need them in dealing with her medical issues and the lost of her close family member with cancer, she declined.    Spent more than half the time coordinating care.    Cordelro Gautreau, MD 9/9/20149:51 PM

## 2013-02-12 NOTE — Telephone Encounter (Signed)
Patient is scheduled for colon 03/05/13 9:30, and pre-visit 02/26/13 10:30

## 2013-02-12 NOTE — Telephone Encounter (Signed)
I called with her lab results from 02/11/13. She asked if a copy of these could be mailed to her. These were placed in the mail.

## 2013-02-25 ENCOUNTER — Ambulatory Visit (AMBULATORY_SURGERY_CENTER): Payer: Medicare Other | Admitting: *Deleted

## 2013-02-25 VITALS — Ht 61.0 in | Wt 203.0 lb

## 2013-02-25 DIAGNOSIS — Z800 Family history of malignant neoplasm of digestive organs: Secondary | ICD-10-CM

## 2013-02-25 MED ORDER — MOVIPREP 100 G PO SOLR: ORAL | Status: DC

## 2013-02-25 NOTE — Progress Notes (Signed)
Patient denies any allergies to eggs or soy. Patient denies any problems with anesthesia.  

## 2013-03-05 ENCOUNTER — Encounter: Payer: Self-pay | Admitting: Gastroenterology

## 2013-03-05 ENCOUNTER — Ambulatory Visit (AMBULATORY_SURGERY_CENTER): Payer: Medicare Other | Admitting: Gastroenterology

## 2013-03-05 VITALS — BP 116/64 | HR 65 | Temp 97.4°F | Resp 12 | Ht 61.0 in | Wt 203.0 lb

## 2013-03-05 DIAGNOSIS — Z800 Family history of malignant neoplasm of digestive organs: Secondary | ICD-10-CM

## 2013-03-05 DIAGNOSIS — D126 Benign neoplasm of colon, unspecified: Secondary | ICD-10-CM

## 2013-03-05 DIAGNOSIS — Z1211 Encounter for screening for malignant neoplasm of colon: Secondary | ICD-10-CM

## 2013-03-05 MED ORDER — SODIUM CHLORIDE 0.9 % IV SOLN: 500.0000 mL | INTRAVENOUS | Status: DC

## 2013-03-05 NOTE — Progress Notes (Signed)
Called to room to assist during endoscopic procedure.  Patient ID and intended procedure confirmed with present staff. Received instructions for my participation in the procedure from the performing physician.  

## 2013-03-05 NOTE — Patient Instructions (Addendum)
YOU HAD AN ENDOSCOPIC PROCEDURE TODAY AT THE [facility]: Refer to the procedure report that was given to you for any specific questions about what was found during the examination.  If the procedure report does not answer your questions, please call your gastroenterologist to clarify.  If you requested that your care partner not be given the details of your procedure findings, then the procedure report has been included in a sealed envelope for you to review at your convenience later.  YOU SHOULD EXPECT: Some feelings of bloating in the abdomen. Passage of more gas than usual.  Walking can help get rid of the air that was put into your GI tract during the procedure and reduce the bloating. If you had a lower endoscopy (such as a colonoscopy or flexible sigmoidoscopy) you may notice spotting of blood in your stool or on the toilet paper. If you underwent a bowel prep for your procedure, then you may not have a normal bowel movement for a few days.  DIET: Your first meal following the procedure should be a light meal and then it is ok to progress to your normal diet.  A half-sandwich or bowl of soup is an example of a good first meal.  Heavy or fried foods are harder to digest and may make you feel nauseous or bloated.  Likewise meals heavy in dairy and vegetables can cause extra gas to form and this can also increase the bloating.  Drink plenty of fluids but you should avoid alcoholic beverages for 24 hours. Try to eat a high fiber diet and drink plenty of WATER.  All of this will help with your Diverticulosis and hemorrhoids.  ACTIVITY: Your care partner should take you home directly after the procedure.  You should plan to take it easy, moving slowly for the rest of the day.  You can resume normal activity the day after the procedure however you should NOT DRIVE or use heavy machinery for 24 hours (because of the sedation medicines used during the test).  Your next colonoscopy should be in 5  years.    SYMPTOMS TO REPORT IMMEDIATELY: A gastroenterologist can be reached at any hour.  During normal business hours, 8:30 AM to 5:00 PM Monday through Friday, call [phone].  After hours and on weekends, please call the GI answering service at [phone] who will take a message and have the physician on call contact you.   Following lower endoscopy (colonoscopy or flexible sigmoidoscopy):  Excessive amounts of blood in the stool  Significant tenderness or worsening of abdominal pains  Swelling of the abdomen that is new, acute  Fever of 100F or higher  FOLLOW UP: If any biopsies were taken you will be contacted by phone or by letter within the next 1-3 weeks.  Call your gastroenterologist if you have not heard about the biopsies in 3 weeks.  Our staff will call the home number listed on your records the next business day following your procedure to check on you and address any questions or concerns that you may have at that time regarding the information given to you following your procedure. This is a courtesy call and so if there is no answer at the home number and we have not heard from you through the emergency physician on call, we will assume that you have returned to your regular daily activities without incident.  SIGNATURES/CONFIDENTIALITY: You and/or your care partner have signed paperwork which will be entered into your electronic medical  record.  These signatures attest to the fact that that the information above on your After Visit Summary has been reviewed and is understood.  Full responsibility of the confidentiality of this discharge information lies with you and/or your care-partner. 

## 2013-03-05 NOTE — Op Note (Signed)
[facility] [address] [place], [address]   COLONOSCOPY PROCEDURE REPORT PATIENT: Felicia Cortez, Felicia Cortez  MR#: [id number] BIRTHDATE: Nov 11, 1940 , 71  yrs. old GENDER: Female ENDOSCOPIST: Meryl Dare, MD, [facility] PROCEDURE DATE:  03/05/2013 PROCEDURE:   Colonoscopy with snare polypectomy First Screening Colonoscopy - Avg.  risk and is 50 yrs.  old or older - No.  Prior Negative Screening - Now for repeat screening. Above average risk  History of Adenoma - Now for follow-up colonoscopy & has been > or = to 3 yrs.  N/A  Polyps Removed Today? Yes. ASA CLASS:   Class II INDICATIONS:elevated risk screening and Patient's immediate family history of colon cancer. MEDICATIONS: MAC sedation, administered by CRNA and propofol (Diprivan) 200mg  IV DESCRIPTION OF PROCEDURE:   After the risks benefits and alternatives of the procedure were thoroughly explained, informed consent was obtained.  A digital rectal exam revealed no abnormalities of the rectum.   The [id number]  endoscope was introduced through the anus and advanced to the cecum, which was identified by both the appendix and ileocecal valve. No adverse events experienced.   The quality of the prep was excellent, using MoviPrep  The instrument was then slowly withdrawn as the colon was fully examined.  COLON FINDINGS: A sessile polyp measuring 5 mm in size was found in the transverse colon.  A polypectomy was performed with a cold snare.  The resection was complete and the polyp tissue was completely retrieved.   A sessile polyp measuring 5 mm in size was found in the rectum. A polypectomy was performed with a cold snare. The resection was complete and the polyp tissue was completely retrieved. Moderate diverticulosis was noted in the sigmoid colon. The colon was otherwise normal.  There was no diverticulosis, inflammation, polyps or cancers unless previously stated. Retroflexed views revealed moderate  internal hemorrhoids. The time to cecum=2 minutes 08 seconds.  Withdrawal time=11 minutes 22 seconds.  The scope was withdrawn and the procedure completed. COMPLICATIONS: There were no complications.  ENDOSCOPIC IMPRESSION: 1.   Sessile polyp measuring 5 mm in the transverse colon; polypectomy performed with a cold snare 2.   Sessile polyp measuring 5 mm in the rectum; polypectomy performed with a cold snare 3.   Moderate diverticulosis was noted in the sigmoid colon 4.   Moderate internal hemorrhoids  RECOMMENDATIONS: 1.  Await pathology results 2.  High fiber diet with liberal fluid intake. 3.  Repeat Colonoscopy in 5 years.  eSigned:  Meryl Dare, MD, [facility] 03/05/2013 9:41 AM   cc: Creola Corn, MD

## 2013-03-05 NOTE — Progress Notes (Signed)
Patient did not have preoperative order for IV antibiotic SSI prophylaxis. ([id number])   

## 2013-03-06 ENCOUNTER — Telehealth: Payer: Self-pay | Admitting: *Deleted

## 2013-03-06 NOTE — Telephone Encounter (Signed)
  Follow up Call-  Call back number 03/05/2013  Post procedure Call Back phone  # [phone]  Permission to leave phone message Yes     Patient questions:  Do you have a fever, pain , or abdominal swelling? no Pain Score  0 *  Have you tolerated food without any problems? yes  Have you been able to return to your normal activities? yes  Do you have any questions about your discharge instructions: Diet   no Medications  no Follow up visit  no  Do you have questions or concerns about your Care? no  Actions: * If pain score is 4 or above: No action needed, pain <4.

## 2013-03-14 ENCOUNTER — Encounter: Payer: Self-pay | Admitting: Gastroenterology

## 2013-04-03 ENCOUNTER — Encounter: Payer: Self-pay | Admitting: Internal Medicine

## 2013-04-03 NOTE — Progress Notes (Signed)
Patient left a message. I called back [phone] and I got fast busy beeps.

## 2013-05-17 ENCOUNTER — Other Ambulatory Visit: Payer: Self-pay | Admitting: Internal Medicine

## 2013-05-17 ENCOUNTER — Ambulatory Visit
Admission: RE | Admit: 2013-05-17 | Discharge: 2013-05-17 | Disposition: A | Discharge status: Home / Self Care | Payer: Medicare Other | Source: Ambulatory Visit | Attending: Internal Medicine | Admitting: Internal Medicine

## 2013-05-17 DIAGNOSIS — R1011 Right upper quadrant pain: Secondary | ICD-10-CM

## 2013-08-05 ENCOUNTER — Encounter (INDEPENDENT_AMBULATORY_CARE_PROVIDER_SITE_OTHER): Payer: Self-pay

## 2013-08-05 ENCOUNTER — Other Ambulatory Visit (HOSPITAL_BASED_OUTPATIENT_CLINIC_OR_DEPARTMENT_OTHER): Payer: Medicare Other

## 2013-08-05 ENCOUNTER — Ambulatory Visit (HOSPITAL_BASED_OUTPATIENT_CLINIC_OR_DEPARTMENT_OTHER): Payer: Medicare Other | Admitting: Internal Medicine

## 2013-08-05 ENCOUNTER — Other Ambulatory Visit: Payer: Self-pay | Admitting: Internal Medicine

## 2013-08-05 VITALS — BP 146/62 | HR 70 | Temp 97.8°F | Resp 18 | Ht 61.0 in | Wt 198.9 lb

## 2013-08-05 DIAGNOSIS — D472 Monoclonal gammopathy: Secondary | ICD-10-CM

## 2013-08-05 DIAGNOSIS — D649 Anemia, unspecified: Secondary | ICD-10-CM

## 2013-08-05 DIAGNOSIS — E538 Deficiency of other specified B group vitamins: Secondary | ICD-10-CM

## 2013-08-05 LAB — IRON AND TIBC CHCC
%SAT: 21 % (ref 21–57)
IRON: 73 ug/dL (ref 41–142)
TIBC: 344 ug/dL (ref 236–444)
UIBC: 271 ug/dL (ref 120–384)

## 2013-08-05 LAB — CBC WITH DIFFERENTIAL/PLATELET
BASO%: 0.5 % (ref 0.0–2.0)
Basophils Absolute: 0 10*3/uL (ref 0.0–0.1)
EOS%: 2.3 % (ref 0.0–7.0)
Eosinophils Absolute: 0.2 10*3/uL (ref 0.0–0.5)
HEMATOCRIT: 35.6 % (ref 34.8–46.6)
HGB: 12.2 g/dL (ref 11.6–15.9)
LYMPH%: 29.1 % (ref 14.0–49.7)
MCH: 31.4 pg (ref 25.1–34.0)
MCHC: 34.2 g/dL (ref 31.5–36.0)
MCV: 91.8 fL (ref 79.5–101.0)
MONO#: 0.6 10*3/uL (ref 0.1–0.9)
MONO%: 8.8 % (ref 0.0–14.0)
NEUT#: 3.8 10*3/uL (ref 1.5–6.5)
NEUT%: 59.3 % (ref 38.4–76.8)
PLATELETS: 207 10*3/uL (ref 145–400)
RBC: 3.88 10*6/uL (ref 3.70–5.45)
RDW: 12.8 % (ref 11.2–14.5)
WBC: 6.5 10*3/uL (ref 3.9–10.3)
lymph#: 1.9 10*3/uL (ref 0.9–3.3)

## 2013-08-05 LAB — COMPREHENSIVE METABOLIC PANEL (CC13)
ALK PHOS: 66 U/L (ref 40–150)
ALT: 26 U/L (ref 0–55)
AST: 27 U/L (ref 5–34)
Albumin: 3.8 g/dL (ref 3.5–5.0)
Anion Gap: 9 mEq/L (ref 3–11)
BILIRUBIN TOTAL: 0.37 mg/dL (ref 0.20–1.20)
BUN: 17.4 mg/dL (ref 7.0–26.0)
CALCIUM: 10.1 mg/dL (ref 8.4–10.4)
CHLORIDE: 103 meq/L (ref 98–109)
CO2: 25 mEq/L (ref 22–29)
CREATININE: 0.9 mg/dL (ref 0.6–1.1)
Glucose: 155 mg/dl — ABNORMAL HIGH (ref 70–140)
Potassium: 4.1 mEq/L (ref 3.5–5.1)
Sodium: 137 mEq/L (ref 136–145)
Total Protein: 7.7 g/dL (ref 6.4–8.3)

## 2013-08-05 LAB — FERRITIN CHCC: Ferritin: 98 ng/ml (ref 9–269)

## 2013-08-05 NOTE — Progress Notes (Signed)
[facility] OFFICE PROGRESS NOTE  Precious Reel, MD [place], [place]. [place] [address]  DIAGNOSIS: MGUS (monoclonal gammopathy of unknown significance) - Plan: CBC with Differential, Comprehensive metabolic panel (Cmet) - [facility], IgG, IgA, IgM, Kappa/lambda light chains  Chief Complaint  Patient presents with  . MGUS (monoclonal gammopathy of unknown significance)    CURRENT TREATMENT: Observation.  INTERVAL HISTORY: Felicia Cortez 73 y.o. 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 She was last seen by me on 02/11/2013.  She is up-to-date for her cancer screening.  She denies recent hospitalizations and reports compliance to all her medications.  There has been no significant change in her condition.  She is following with her PCP Dr. Virgina Jock with some changes in her diabetes medications.     MEDICAL HISTORY: Past Medical History  Diagnosis Date  . Diabetes mellitus without complication   . Hypertension   . Hyperlipidemia   . Thyroid disease   . Sleep apnea     uses CPAP  . Monoclonal gammopathy   . Arthritis     INTERIM HISTORY: has MGUS (monoclonal gammopathy of unknown significance); Other B-complex deficiencies; and Anemia, unspecified on her problem list.    ALLERGIES:  has No Known Allergies.  MEDICATIONS: has a current medication list which includes the following prescription(s): b-d ultrafine iii short pen, calcium-vitamin d, exenatide, glimepiride, hydrochlorothiazide, insulin glargine, irbesartan-hydrochlorothiazide, levothyroxine, multivitamin, one touch ultra test, and simvastatin.  SURGICAL HISTORY:  Past Surgical History  Procedure Laterality Date  . Tonsillectomy and adenoidectomy    . Nose surgery    . Carpal tunnel release    . Ovary surgery      REVIEW OF SYSTEMS:   Constitutional: Denies fevers, chills or abnormal weight loss Eyes: Denies blurriness of  vision Ears, nose, mouth, throat, and face: Denies mucositis or sore throat Respiratory: Denies cough, dyspnea or wheezes Cardiovascular: Denies palpitation, chest discomfort or lower extremity swelling Gastrointestinal:  Denies nausea, heartburn or change in bowel habits Skin: Denies abnormal skin rashes Lymphatics: Denies new lymphadenopathy or easy bruising Neurological:Denies numbness, tingling or new weaknesses Behavioral/Psych: Mood is stable, no new changes  All other systems were reviewed with the patient and are negative.  PHYSICAL EXAMINATION: ECOG PERFORMANCE STATUS: 0 - Asymptomatic  Blood pressure 146/62, pulse 70, temperature 97.8 F (36.6 C), temperature source Oral, resp. rate 18, height _0  (1.549 m), weight 198 lb 14.4 oz (90.22 kg), SpO2 99.00%.  GENERAL:alert, no distress and comfortable, moderately obese.  SKIN: skin color, texture, turgor are normal, no rashes or significant lesions EYES: normal, Conjunctiva are pink and non-injected, sclera clear OROPHARYNX:no exudate, no erythema and lips, buccal mucosa, and tongue normal  NECK: supple, thyroid normal size, non-tender, without nodularity LYMPH:  no palpable lymphadenopathy in the cervical, axillary or supraclavicular LUNGS: clear to auscultation with normal breathing effort, no wheezes or rhonchi HEART: regular rate & rhythm and no murmurs and no lower extremity edema ABDOMEN:abdomen soft, non-tender and normal bowel sounds Musculoskeletal:no cyanosis of digits and no clubbing  NEURO: alert & oriented x 3 with fluent speech, no focal motor/sensory deficits  Labs:  Lab Results  Component Value Date   WBC 6.5 08/05/2013   HGB 12.2 08/05/2013   HCT 35.6 08/05/2013   MCV 91.8 08/05/2013   PLT 207 08/05/2013   NEUTROABS 3.8 08/05/2013      Chemistry      Component Value Date/Time  NA 137 08/05/2013 0932   NA 140 08/08/2011 1041   K 4.1 08/05/2013 0932   K 4.0 08/08/2011 1041   CL 101 08/13/2012 1508   CL 101 08/08/2011  1041   CO2 25 08/05/2013 0932   CO2 27 08/08/2011 1041   BUN 17.4 08/05/2013 0932   BUN 20 08/08/2011 1041   CREATININE 0.9 08/05/2013 0932   CREATININE 0.86 08/08/2011 1041   CREATININE 0.73 05/27/2009 1219      Component Value Date/Time   CALCIUM 10.1 08/05/2013 0932   CALCIUM 9.8 08/08/2011 1041   ALKPHOS 66 08/05/2013 0932   ALKPHOS 58 08/08/2011 1041   AST 27 08/05/2013 0932   AST 20 08/08/2011 1041   ALT 26 08/05/2013 0932   ALT 20 08/08/2011 1041   BILITOT 0.37 08/05/2013 0932   BILITOT 0.4 08/08/2011 1041       Basic Metabolic Panel:  Recent Labs Lab 08/05/13 0932  NA 137  K 4.1  CO2 25  GLUCOSE 155*  BUN 17.4  CREATININE 0.9  CALCIUM 10.1   GFR Estimated Creatinine Clearance: 58.7 ml/min (by C-G formula based on Cr of 0.9). Liver Function Tests:  Recent Labs Lab 08/05/13 0932  AST 27  ALT 26  ALKPHOS 66  BILITOT 0.37  PROT 7.7  ALBUMIN 3.8   CBC:  Recent Labs Lab 08/05/13 0932  WBC 6.5  NEUTROABS 3.8  HGB 12.2  HCT 35.6  MCV 91.8  PLT 207    Anemia work up  Recent Labs  08/05/13 0932  FERRITIN 98  TIBC 344  IRON 73    Studies:  No results found.   RADIOGRAPHIC STUDIES: No results found.  ASSESSMENT: Felicia Cortez 73 y.o. female with a history of MGUS (monoclonal gammopathy of unknown significance) - Plan: CBC with Differential, Comprehensive metabolic panel (Cmet) - [facility], IgG, IgA, IgM, Kappa/lambda light chains   PLAN:  1. MGUS.  --Felicia Cortez continues with stable and normal immunoglobulin levels and no signs of progression of multiple myeloma.   2. Follow-up. --We will plan to see Felicia Cortez again in 6 months at which time we will check CBC, chemistries, quantitative immunoglobulins, iron studies and vitamin B12 level as the patient does have history of pernicious anemia.   All questions were answered. The patient knows to call the clinic with any problems, questions or concerns. We can certainly see the patient much sooner if necessary.  I spent  10 minutes counseling the patient face to face. The total time spent in the appointment was 15 minutes.    Charlii Yost, MD 08/05/2013 10:23 PM

## 2013-08-06 ENCOUNTER — Telehealth: Payer: Self-pay | Admitting: Internal Medicine

## 2013-08-06 LAB — IGG, IGA, IGM
IGG (IMMUNOGLOBIN G), SERUM: 1230 mg/dL (ref 690–1700)
IgA: 284 mg/dL (ref 69–380)
IgM, Serum: 57 mg/dL (ref 52–322)

## 2013-08-06 NOTE — Telephone Encounter (Signed)
, °

## 2014-02-06 ENCOUNTER — Ambulatory Visit (HOSPITAL_BASED_OUTPATIENT_CLINIC_OR_DEPARTMENT_OTHER): Payer: Medicare Other | Admitting: Hematology

## 2014-02-06 ENCOUNTER — Telehealth: Payer: Self-pay | Admitting: Hematology

## 2014-02-06 ENCOUNTER — Other Ambulatory Visit (HOSPITAL_BASED_OUTPATIENT_CLINIC_OR_DEPARTMENT_OTHER): Payer: Medicare Other

## 2014-02-06 VITALS — BP 120/57 | HR 73 | Temp 98.3°F | Resp 19 | Ht 61.0 in | Wt 192.2 lb

## 2014-02-06 DIAGNOSIS — D472 Monoclonal gammopathy: Secondary | ICD-10-CM

## 2014-02-06 DIAGNOSIS — K7689 Other specified diseases of liver: Secondary | ICD-10-CM

## 2014-02-06 LAB — COMPREHENSIVE METABOLIC PANEL (CC13)
ALK PHOS: 67 U/L (ref 40–150)
ALT: 24 U/L (ref 0–55)
AST: 24 U/L (ref 5–34)
Albumin: 3.6 g/dL (ref 3.5–5.0)
Anion Gap: 10 mEq/L (ref 3–11)
BILIRUBIN TOTAL: 0.32 mg/dL (ref 0.20–1.20)
BUN: 18.9 mg/dL (ref 7.0–26.0)
CO2: 26 mEq/L (ref 22–29)
Calcium: 9.8 mg/dL (ref 8.4–10.4)
Chloride: 105 mEq/L (ref 98–109)
Creatinine: 1 mg/dL (ref 0.6–1.1)
GLUCOSE: 151 mg/dL — AB (ref 70–140)
Potassium: 4.4 mEq/L (ref 3.5–5.1)
SODIUM: 141 meq/L (ref 136–145)
Total Protein: 7.7 g/dL (ref 6.4–8.3)

## 2014-02-06 LAB — CBC WITH DIFFERENTIAL/PLATELET
BASO%: 0.7 % (ref 0.0–2.0)
BASOS ABS: 0 10*3/uL (ref 0.0–0.1)
EOS ABS: 0.1 10*3/uL (ref 0.0–0.5)
EOS%: 1.7 % (ref 0.0–7.0)
HCT: 35.5 % (ref 34.8–46.6)
HGB: 11.9 g/dL (ref 11.6–15.9)
LYMPH%: 23.1 % (ref 14.0–49.7)
MCH: 30.6 pg (ref 25.1–34.0)
MCHC: 33.4 g/dL (ref 31.5–36.0)
MCV: 91.8 fL (ref 79.5–101.0)
MONO#: 0.7 10*3/uL (ref 0.1–0.9)
MONO%: 9.4 % (ref 0.0–14.0)
NEUT%: 65.1 % (ref 38.4–76.8)
NEUTROS ABS: 4.6 10*3/uL (ref 1.5–6.5)
Platelets: 220 10*3/uL (ref 145–400)
RBC: 3.87 10*6/uL (ref 3.70–5.45)
RDW: 12.9 % (ref 11.2–14.5)
WBC: 7.1 10*3/uL (ref 3.9–10.3)
lymph#: 1.6 10*3/uL (ref 0.9–3.3)

## 2014-02-07 LAB — IGG, IGA, IGM
IGA: 243 mg/dL (ref 69–380)
IGG (IMMUNOGLOBIN G), SERUM: 1390 mg/dL (ref 690–1700)
IgM, Serum: 50 mg/dL — ABNORMAL LOW (ref 52–322)

## 2014-02-07 LAB — KAPPA/LAMBDA LIGHT CHAINS
KAPPA FREE LGHT CHN: 2.77 mg/dL — AB (ref 0.33–1.94)
Kappa:Lambda Ratio: 0.64 (ref 0.26–1.65)
LAMBDA FREE LGHT CHN: 4.32 mg/dL — AB (ref 0.57–2.63)

## 2014-02-10 NOTE — Progress Notes (Signed)
[facility] OFFICE PROGRESS NOTE 02/06/2014  Precious Reel, MD [place] [address]  DIAGNOSIS: MGUS (Monoclonal gammopathy of unknown Significance)   Chief Complaint  Patient presents with  . Follow-up    CURRENT TREATMENT: Observation.  INTERVAL HISTORY:  Felicia Cortez 74 y.o. 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 She was last seen by Dr Juliann Mule on 08/05/2013.  She is up-to-date for her cancer screening.  She denies recent hospitalizations and reports compliance to all her medications.  There has been no significant change in her condition.  She is following with her PCP Dr. Virgina Jock with some changes in her diabetes medications.   Currently she is on Insulin/Victoza/Amaryl for diabetes, antihypertensives for HTN, Synthroid for hypothyroidism, zocor for hyperlipedemia and a MVI. She had labs done today.  MEDICAL HISTORY: Past Medical History  Diagnosis Date  . Diabetes mellitus without complication   . Hypertension   . Hyperlipidemia   . Thyroid disease   . Sleep apnea     uses CPAP  . Monoclonal gammopathy   . Arthritis     INTERIM HISTORY: has MGUS (monoclonal gammopathy of unknown significance); Other B-complex deficiencies; and Anemia, unspecified on her problem list.    ALLERGIES:  has No Known Allergies.  MEDICATIONS: has a current medication list which includes the following prescription(s): glimepiride, hydrochlorothiazide, insulin glargine, irbesartan-hydrochlorothiazide, levothyroxine, liraglutide, multivitamin, one touch ultra test, and simvastatin.  SURGICAL HISTORY:  Past Surgical History  Procedure Laterality Date  . Tonsillectomy and adenoidectomy    . Nose surgery    . Carpal tunnel release    . Ovary surgery      REVIEW OF SYSTEMS:   Constitutional: Denies fevers, chills or abnormal weight loss Eyes: Denies blurriness of vision Ears, nose, mouth, throat, and  face: Denies mucositis or sore throat Respiratory: Denies cough, dyspnea or wheezes Cardiovascular: Denies palpitation, chest discomfort or lower extremity swelling Gastrointestinal:  Denies nausea, heartburn or change in bowel habits Skin: Denies abnormal skin rashes Lymphatics: Denies new lymphadenopathy or easy bruising Neurological:Denies numbness, tingling or new weaknesses Behavioral/Psych: Mood is stable, no new changes  All other systems were reviewed with the patient and are negative.  PHYSICAL EXAMINATION: ECOG PERFORMANCE STATUS: 0 - Asymptomatic  Blood pressure 120/57, pulse 73, temperature 98.3 F (36.8 C), temperature source Oral, resp. rate 19, height 5' 1" (1.549 m), weight 192 lb 3.2 oz (87.181 kg), SpO2 98.00%.  GENERAL:alert, no distress and comfortable, moderately obese.  SKIN: skin color, texture, turgor are normal, no rashes or significant lesions EYES: normal, Conjunctiva are pink and non-injected, sclera clear OROPHARYNX:no exudate, no erythema and lips, buccal mucosa, and tongue normal  NECK: supple, thyroid normal size, non-tender, without nodularity LYMPH:  no palpable lymphadenopathy in the cervical, axillary or supraclavicular LUNGS: clear to auscultation with normal breathing effort, no wheezes or rhonchi HEART: regular rate & rhythm and no murmurs and no lower extremity edema ABDOMEN:abdomen soft, non-tender and normal bowel sounds Musculoskeletal:no cyanosis of digits and no clubbing  NEURO: alert & oriented x 3 with fluent speech, no focal motor/sensory deficits  Labs:              RADIOGRAPHIC STUDIES:  US ABDOMEN 05/17/2013   CLINICAL DATA: Right upper quadrant/abdominal pain. EXAM: ULTRASOUND ABDOMEN COMPLETE COMPARISON: Ultrasound 10/25/2007.  FINDINGS: Gallbladder: No gallstones or wall thickening visualized. No sonographic Murphy sign noted. Common bile duct: Diameter: 3.9 mm Liver: Diffusely increased  in echogenicity. No focal  lesion identified. Measures 16.1 cm in length. IVC:  No abnormality visualized. Pancreas: Poorly visualized due to overlying bowel gas.  Spleen: Size and appearance within normal limits. Right Kidney: Length: 10.6 cm. Mild renal cortical thinning. Normal renal cortical echogenicity. There is a 3.5 cm cyst within the lower pole. No hydronephrosis. Left Kidney: Length: 9.9 cm. Mild renal cortical thinning. Normal renal cortical echogenicity. No hydronephrosis. Abdominal aorta: No aneurysm visualized. Other findings: None.  IMPRESSION:  1. No cholelithiasis or sonographic evidence for acute  cholecystitis.  2. Hepatic steatosis      ASSESSMENT: Felicia Cortez 73 y.o. female with a history of MGUS (monoclonal gammopathy of unknown significance) - Plan: CBC with Differential, Comprehensive metabolic panel (Cmet) - [facility], SPEP & IFE with QIG, Kappa/lambda light chains   PLAN:   1. MGUS.  --Felicia Cortez continues with stable and normal immunoglobulin levels and no signs of progression of multiple myeloma. Kappa/Lambda ratio is normal and that is normal.  2. Follow-up. --We will plan to see Felicia Cortez again in 12 months at which time we will check CBC, chemistries, quantitative immunoglobulins and serum light chain assay. Also check vitamin B12 level as the patient does have history of pernicious anemia.   All questions were answered. The patient knows to call the clinic with any problems, questions or concerns. We can certainly see the patient much sooner if necessary.  I spent 20 minutes counseling the patient face to face. The total time spent in the appointment was 25 minutes.    Bernadene Bell, MD Medical Hematologist/Oncologist [place] Pager: [phone] Office No: [phone]

## 2014-02-17 ENCOUNTER — Encounter: Payer: Self-pay | Admitting: Medical Oncology

## 2014-02-17 NOTE — Progress Notes (Signed)
Labs mailed to pt per her request

## 2014-10-05 DEATH — deceased

## 2015-01-27 ENCOUNTER — Telehealth: Payer: Self-pay | Admitting: Oncology

## 2015-01-27 NOTE — Telephone Encounter (Signed)
Moved 9/8 appointments to FS. Left message for patient and mailed schedule.

## 2015-01-30 ENCOUNTER — Telehealth: Payer: Self-pay | Admitting: *Deleted

## 2015-01-30 NOTE — Telephone Encounter (Signed)
Patient passed away in April. Sent POF to cancel further appointments

## 2015-02-02 ENCOUNTER — Telehealth: Payer: Self-pay | Admitting: Oncology

## 2015-02-02 NOTE — Telephone Encounter (Signed)
Appointments cancelled per pof as patient has expired

## 2015-02-12 ENCOUNTER — Ambulatory Visit: Payer: Medicare Other

## 2015-02-12 ENCOUNTER — Ambulatory Visit: Payer: Medicare Other | Admitting: Oncology

## 2015-02-12 ENCOUNTER — Other Ambulatory Visit: Payer: Medicare Other

## 2015-02-17 ENCOUNTER — Encounter: Payer: Self-pay | Admitting: Gastroenterology

## 2015-04-16 IMAGING — US US ABDOMEN COMPLETE
1 series · 14 of 25 positions shown · non-contrast
Comparison: Ultrasound 10/25/2007.

CLINICAL DATA: Right upper quadrant/abdominal pain.

EXAM:
ULTRASOUND ABDOMEN COMPLETE

[Series 1: us abdomen complete · 0.37mm/px · 14 of 74 slices shown]
[im 1/74]
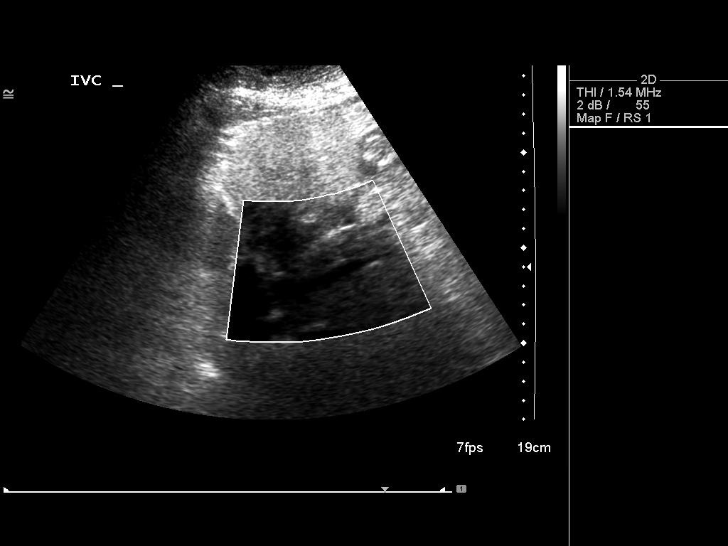
[im 7/74]
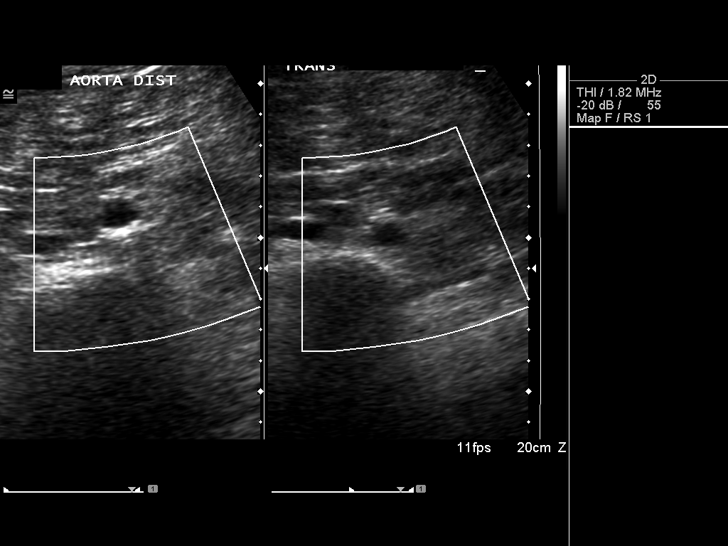
[im 13/74]
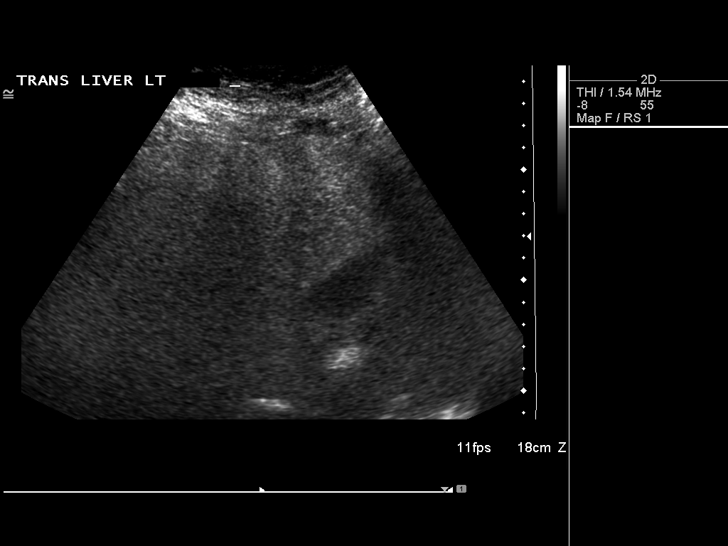
[im 19/74]
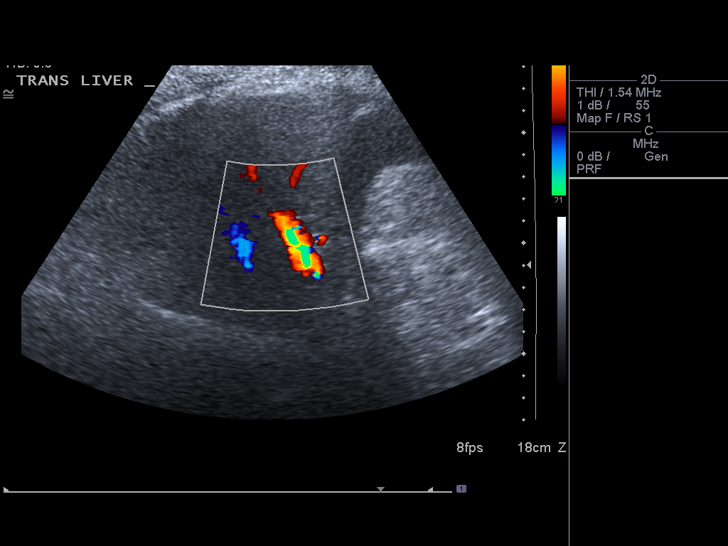
[im 25/74]
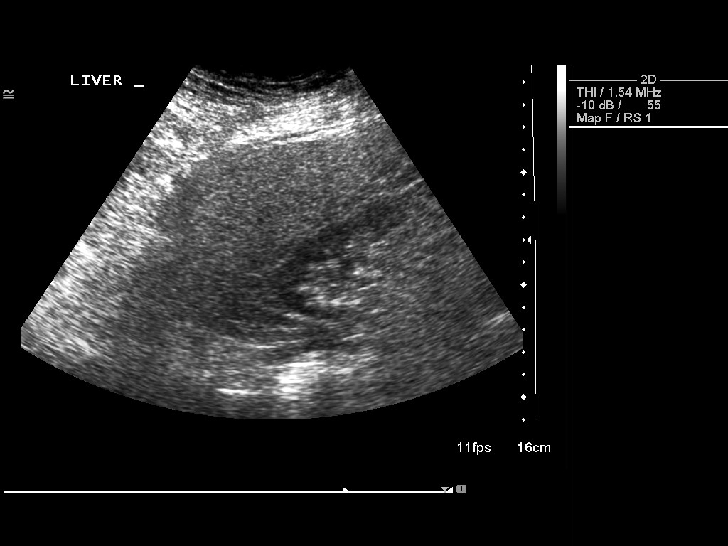
[im 28/74]
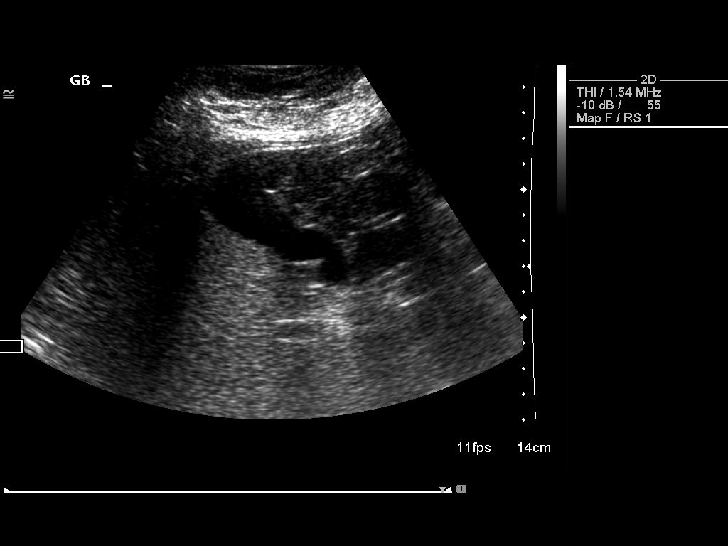
[im 34/74]
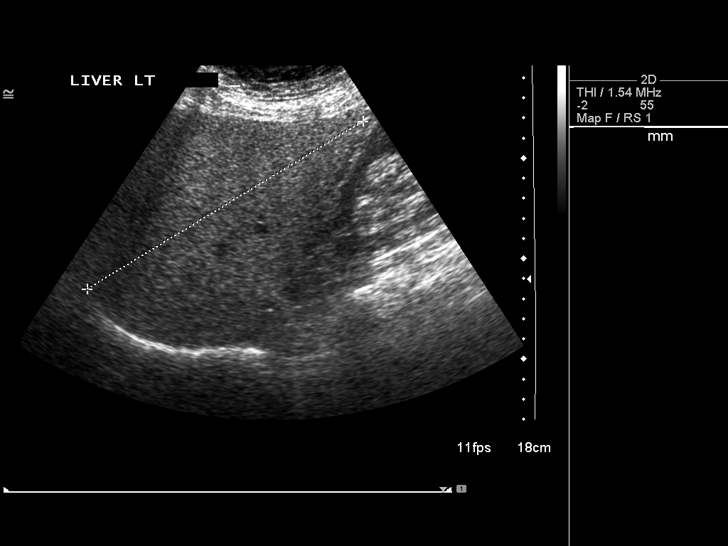
[im 40/74]
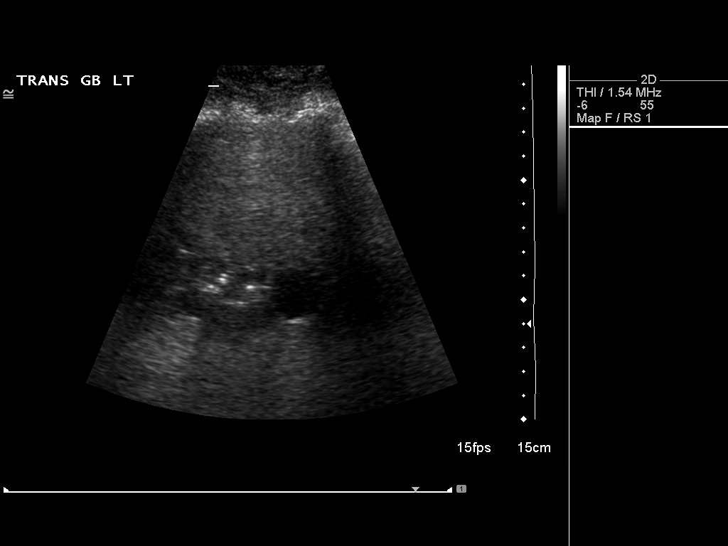
[im 46/74]
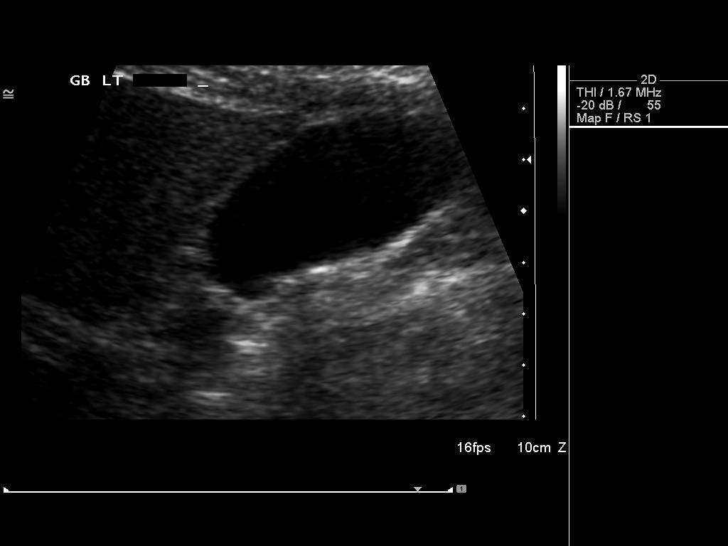
[im 49/74]
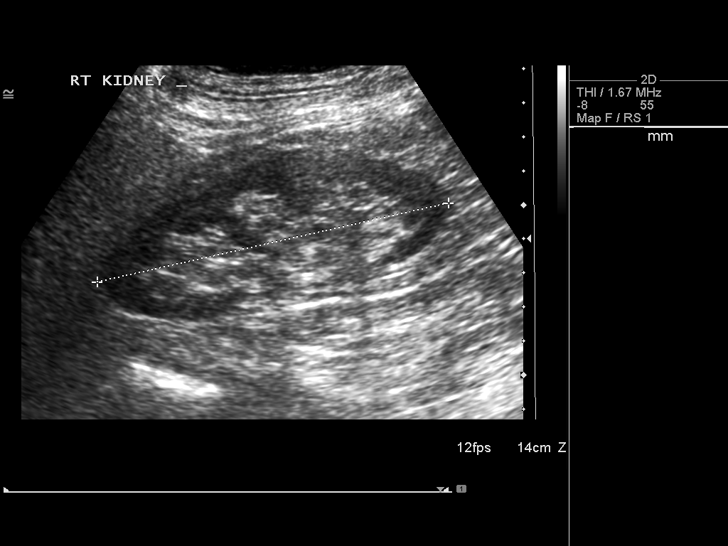
[im 55/74]
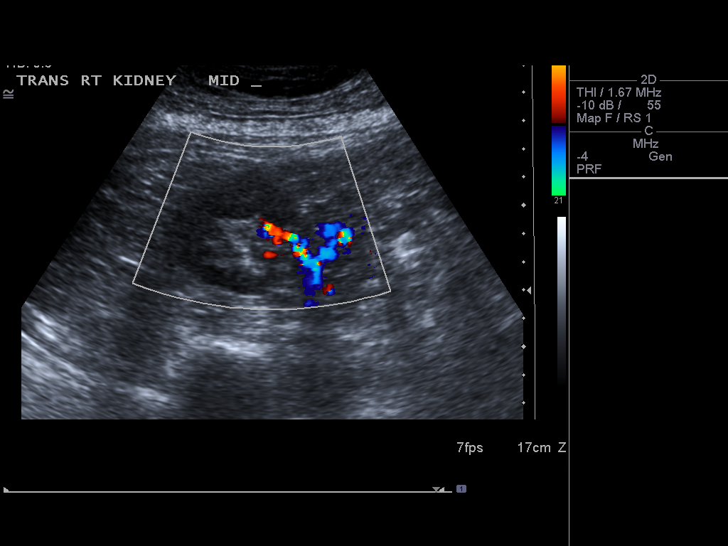
[im 61/74]
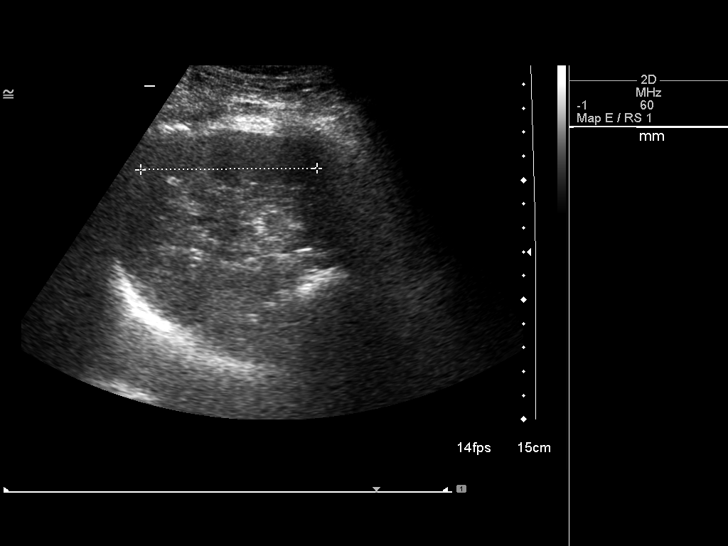
[im 67/74]
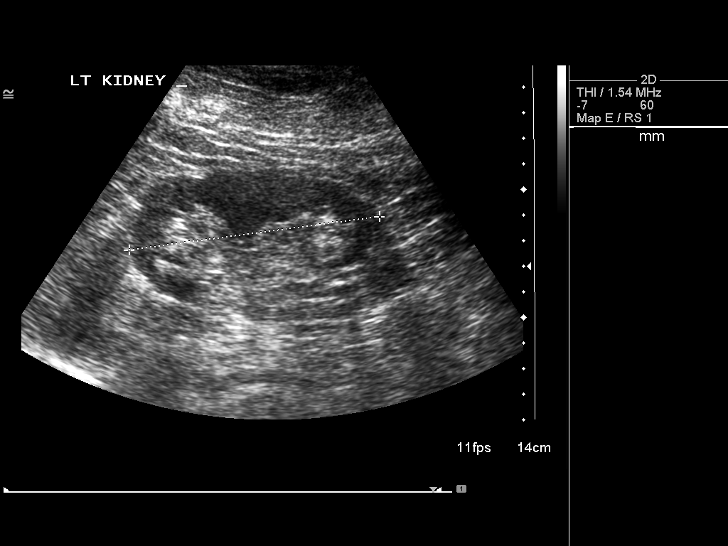
[im 74/74]
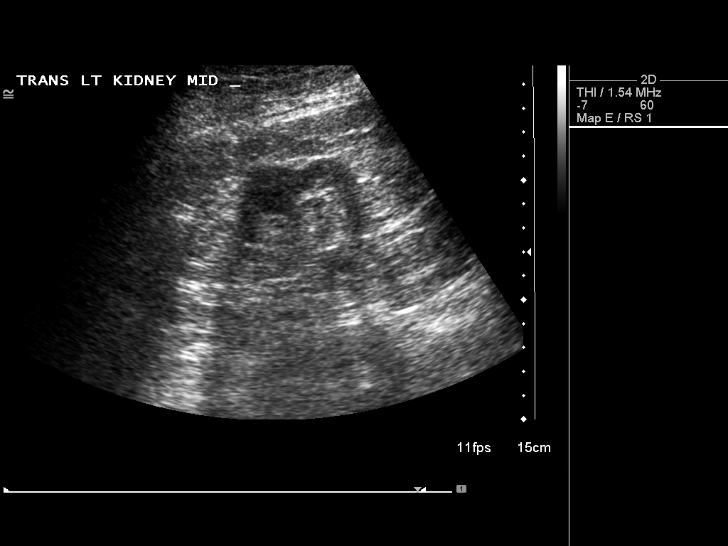

[14 of 25 positions shown; findings below may reference images not displayed]

FINDINGS: Gallbladder:

No gallstones or wall thickening visualized. No sonographic Murphy
sign noted.

Common bile duct:

Diameter: 3.9 mm

Liver:

Diffusely increased in echogenicity. No focal lesion identified.
Measures 16.1 cm in length.

IVC:

No abnormality visualized.

Pancreas:

Poorly visualized due to overlying bowel gas.

Spleen:

Size and appearance within normal limits.

Right Kidney:

Length: 10.6 cm. Mild renal cortical thinning. Normal renal cortical
echogenicity. There is a 3.5 cm cyst within the lower pole. No
hydronephrosis.

Left Kidney:

Length: 9.9 cm. Mild renal cortical thinning. Normal renal cortical
echogenicity. No hydronephrosis.

Abdominal aorta:

No aneurysm visualized.

Other findings:

None.
IMPRESSION: 1. No cholelithiasis or sonographic evidence for acute
cholecystitis.
2. Hepatic steatosis.
